# Patient Record
Sex: Female | Born: 2000 | Race: White | Hispanic: No | Marital: Married | Smoking: Never smoker
Health system: Southern US, Community
[De-identification: ages and names within clinical notes are randomized; demographics above are authoritative.]

## PROBLEM LIST (undated history)

## (undated) DIAGNOSIS — F909 Attention-deficit hyperactivity disorder, unspecified type: Secondary | ICD-10-CM

## (undated) HISTORY — DX: Attention-deficit hyperactivity disorder, unspecified type: F90.9

## (undated) HISTORY — PX: DENTAL SURGERY: SHX609

---

## 2020-08-18 NOTE — L&D Delivery Note (Signed)
Delivery Summary for Cynthia Schmitt  Labor Events:   Preterm labor: No data found  Rupture date: No data found  Rupture time: No data found  Rupture type: Possible ROM - for evaluation  Fluid Color: No data found  Induction: No data found  Augmentation: No data found  Complications: No data found  Cervical ripening: No data found No data found   No data found     Delivery:   Episiotomy: No data found  Lacerations: No data found  Repair suture: No data found  Repair # of packets: No data found  Blood loss (ml): 880   Information for the patient's newborn:  Kristia, Jupiter [[id number]]   Delivery 05/28/2021 5:02 PM by  Vaginal, Spontaneous Sex:  female Gestational Age: [redacted]w[redacted]d Delivery Clinician:   Living?:         APGARS  One minute Five minutes Ten minutes  Skin color:        Heart rate:        Grimace:        Muscle tone:        Breathing:        Totals: 6  6      Presentation/position:      Resuscitation:   Cord information:    Disposition of cord blood:     Blood gases sent?  Complications:   Placenta: Delivered:       appearance Newborn Measurements: Weight: 8 lb 6 oz (3800 g)  Height: 21.65"  Head circumference:    Chest circumference:    Other providers:    Additional  information: Forceps:   Vacuum:   Breech:   Observed anomalies      Delivery Note At 5:02 PM a viable and healthy female was delivered via Vaginal, Spontaneous (Presentation: Left Occiput Anterior).  APGAR: 6, 6; weight 8 lb 6 oz (3800 g).   Placenta status: Spontaneous, Intadct.  Cord: 3 vessels with the following complications: None.  Cord pH: not obtained. Delayed cord clamping was observed.   Anesthesia: Epidural Episiotomy: None Lacerations: 3rd degree perineal Suture Repair: 2.0 Vicryl; 3.0 Vicryl - rapide Est. Blood Loss (mL):  880  Mom to postpartum.  Baby to Couplet care / Skin to Skin.  Hildred Laser, MD 05/28/2021, 6:05 PM

## 2020-10-03 ENCOUNTER — Ambulatory Visit (INDEPENDENT_AMBULATORY_CARE_PROVIDER_SITE_OTHER): Payer: Commercial Managed Care - PPO | Admitting: Obstetrics and Gynecology

## 2020-10-03 ENCOUNTER — Encounter: Payer: Self-pay | Admitting: Obstetrics and Gynecology

## 2020-10-03 ENCOUNTER — Other Ambulatory Visit: Payer: Self-pay

## 2020-10-03 VITALS — BP 108/70 | HR 105 | Ht 66.0 in | Wt 114.8 lb

## 2020-10-03 DIAGNOSIS — N912 Amenorrhea, unspecified: Secondary | ICD-10-CM

## 2020-10-03 LAB — POCT URINE PREGNANCY: Preg Test, Ur: POSITIVE — AB

## 2020-10-03 NOTE — Progress Notes (Signed)
HPI:      Ms. Asaiah Hunnicutt is a 20 y.o. G1P0 who LMP was Patient's last menstrual period was 08/15/2020.  Subjective:   She presents today because she has missed a menstrual period and had a positive home pregnancy test.  She is approximately 7 weeks by last menstrual period.  She was attempting pregnancy and began prenatal vitamins prior to conception.  She has occasional nausea but is not vomiting.  She does describe mild pelvic cramping but denies bleeding.  She is very excited about being pregnant.    Hx: The following portions of the patient's history were reviewed and updated as appropriate:             She  has no past medical history on file. She does not have a problem list on file. She  has no past surgical history on file. Her family history includes Ovarian cancer in her mother. She  reports that she has never smoked. She has never used smokeless tobacco. She reports previous alcohol use. She reports previous drug use. She has a current medication list which includes the following prescription(s): multivitamin-prenatal. She has No Known Allergies.       Review of Systems:  Review of Systems  Constitutional: Denied constitutional symptoms, night sweats, recent illness, fatigue, fever, insomnia and weight loss.  Eyes: Denied eye symptoms, eye pain, photophobia, vision change and visual disturbance.  Ears/Nose/Throat/Neck: Denied ear, nose, throat or neck symptoms, hearing loss, nasal discharge, sinus congestion and sore throat.  Cardiovascular: Denied cardiovascular symptoms, arrhythmia, chest pain/pressure, edema, exercise intolerance, orthopnea and palpitations.  Respiratory: Denied pulmonary symptoms, asthma, pleuritic pain, productive sputum, cough, dyspnea and wheezing.  Gastrointestinal: Denied, gastro-esophageal reflux, melena, nausea and vomiting.  Genitourinary: Denied genitourinary symptoms including symptomatic vaginal discharge, pelvic relaxation issues, and urinary  complaints.  Musculoskeletal: Denied musculoskeletal symptoms, stiffness, swelling, muscle weakness and myalgia.  Dermatologic: Denied dermatology symptoms, rash and scar.  Neurologic: Denied neurology symptoms, dizziness, headache, neck pain and syncope.  Psychiatric: Denied psychiatric symptoms, anxiety and depression.  Endocrine: Denied endocrine symptoms including hot flashes and night sweats.   Meds:   Current Outpatient Medications on File Prior to Visit  Medication Sig Dispense Refill  . Prenatal Vit-Fe Fumarate-FA (MULTIVITAMIN-PRENATAL) 27-0.8 MG TABS tablet Take 1 tablet by mouth daily at 12 noon.     No current facility-administered medications on file prior to visit.          Objective:     Vitals:   10/03/20 1021  BP: 108/70  Pulse: (!) 105   Filed Weights   10/03/20 1021  Weight: 114 lb 12.8 oz (52.1 kg)              Urinary pregnancy test positive  Assessment:    G1P0 There are no problems to display for this patient.    1. Amenorrhea     Approximately 7 weeks estimated gestational age based on LMP.   Plan:            Prenatal Plan 1.  The patient was given prenatal literature. 2.  She was continued on prenatal vitamins. 3.  A prenatal lab panel to be drawn at nurse visit. 4.  An ultrasound was ordered to better determine an EDC. 5.  A nurse visit was scheduled. 6.  Genetic testing and testing for other inheritable conditions discussed in detail. She will decide in the future whether to have these labs performed. 7.  A general overview of pregnancy testing, visit schedule, ultrasound  schedule, and prenatal care was discussed. 8.  COVID and its risks associated with pregnancy, prevention by limiting exposure and use of masks, as well as the risks and benefits of vaccination during pregnancy were discussed in detail.  [facility] policy regarding office and hospital visitation and testing was explained. 9.  Benefits of breast-feeding discussed in detail  including both maternal and infant benefits. Ready Set Baby website discussed. 10.  Literature regarding nausea and vomiting in pregnancy given.   Orders Orders Placed This Encounter  Procedures  . US OB Comp Less 14 Wks  . POCT urine pregnancy    No orders of the defined types were placed in this encounter.     F/U  Return in about 6 weeks (around 11/14/2020). I spent 33 minutes involved in the care of this patient preparing to see the patient by obtaining and reviewing her medical history (including labs, imaging tests and prior procedures), documenting clinical information in the electronic health record (EHR), counseling and coordinating care plans, writing and sending prescriptions, ordering tests or procedures and directly communicating with the patient by discussing pertinent items from her history and physical exam as well as detailing my assessment and plan as noted above so that she has an informed understanding.  All of her questions were answered.  Elonda Husky, M.D. 10/03/2020 11:03 AM

## 2020-10-10 ENCOUNTER — Ambulatory Visit (INDEPENDENT_AMBULATORY_CARE_PROVIDER_SITE_OTHER): Payer: Commercial Managed Care - PPO

## 2020-10-10 ENCOUNTER — Other Ambulatory Visit: Payer: Self-pay

## 2020-10-10 DIAGNOSIS — N912 Amenorrhea, unspecified: Secondary | ICD-10-CM | POA: Diagnosis not present

## 2020-10-26 ENCOUNTER — Other Ambulatory Visit: Payer: Self-pay

## 2020-10-26 ENCOUNTER — Ambulatory Visit (INDEPENDENT_AMBULATORY_CARE_PROVIDER_SITE_OTHER): Payer: Commercial Managed Care - PPO

## 2020-10-26 VITALS — BP 107/72 | HR 112 | Ht 66.0 in | Wt 114.0 lb

## 2020-10-26 DIAGNOSIS — Z3401 Encounter for supervision of normal first pregnancy, first trimester: Secondary | ICD-10-CM | POA: Diagnosis not present

## 2020-10-26 DIAGNOSIS — Z0283 Encounter for blood-alcohol and blood-drug test: Secondary | ICD-10-CM

## 2020-10-26 DIAGNOSIS — Z113 Encounter for screening for infections with a predominantly sexual mode of transmission: Secondary | ICD-10-CM

## 2020-10-26 LAB — OB RESULTS CONSOLE GC/CHLAMYDIA: Gonorrhea: NEGATIVE

## 2020-10-26 LAB — OB RESULTS CONSOLE VARICELLA ZOSTER ANTIBODY, IGG: Varicella: IMMUNE

## 2020-10-26 NOTE — Patient Instructions (Signed)
WHAT OB PATIENTS CAN EXPECT   Confirmation of pregnancy and ultrasound ordered if medically indicated-[redacted] weeks gestation  New OB (NOB) intake with nurse and New OB (NOB) labs- [redacted] weeks gestation  New OB (NOB) physical examination with provider- 11/[redacted] weeks gestation  Flu vaccine-[redacted] weeks gestation  Anatomy scan-[redacted] weeks gestation  Glucose tolerance test, blood work to test for anemia, T-dap vaccine-[redacted] weeks gestation  Vaginal swabs/cultures-STD/Group B strep-[redacted] weeks gestation  Appointments every 4 weeks until 28 weeks  Every 2 weeks from 28 weeks until 36 weeks  Weekly visits from 36 weeks until delivery  [email or web address]  Prenatal Care Prenatal care is health care during pregnancy. It helps you and your unborn baby (fetus) stay as healthy as possible. Prenatal care may be provided by a midwife, a family practice doctor, a IT consultant (nurse practitioner or physician assistant), or a childbirth and pregnancy doctor (obstetrician). How does this affect me? During pregnancy, you will be closely monitored for any new conditions that might develop. To lower your risk of pregnancy complications, you and your health care provider will talk about any underlying conditions you have. How does this affect my baby? Early and consistent prenatal care increases the chance that your baby will be healthy during pregnancy. Prenatal care lowers the risk that your baby will be:  Born early (prematurely).  Smaller than expected at birth (small for gestational age). What can I expect at the first prenatal care visit? Your first prenatal care visit will likely be the longest. You should schedule your first prenatal care visit as soon as you know that you are pregnant. Your first visit is a good time to talk about any questions or concerns you have about pregnancy. Medical history At your visit, you and your health care provider will  talk about your medical history, including:  Any past pregnancies.  Your family's medical history.  Medical history of the baby's father.  Any long-term (chronic) health conditions you have and how you manage them.  Any surgeries or procedures you have had.  Any current over-the-counter or prescription medicines, herbs, or supplements that you are taking.  Other factors that could pose a risk to your baby, including: ? Exposure to harmful chemicals or radiation at work or at home. ? Any substance use, including tobacco, alcohol, and drug use.  Your home setting and your stress levels, including: ? Exposure to abuse or violence. ? Household financial strain.  Your daily health habits, including diet and exercise. Tests and screenings Your health care provider will:  Measure your weight, height, and blood pressure.  Do a physical exam, including a pelvic and breast exam.  Perform blood tests and urine tests to check for: ? Urinary tract infection. ? Sexually transmitted infections (STIs). ? Low iron levels in your blood (anemia). ? Blood type and certain proteins on red blood cells (Rh antibodies). ? Infections and immunity to viruses, such as hepatitis B and rubella. ? HIV (human immunodeficiency virus).  Discuss your options for genetic screening. Tips about staying healthy Your health care provider will also give you information about how to keep yourself and your baby healthy, including:  Nutrition and taking vitamins.  Physical activity.  How to manage pregnancy symptoms such as nausea and vomiting (morning sickness).  Infections and substances that may be harmful to your baby and how to avoid them.  Food safety.  Dental care.  Working.  Travel.  Warning signs to watch for and when to call your health care provider.  How often will I have prenatal care visits? After your first prenatal care visit, you will have regular visits throughout your pregnancy.  The visit schedule is often as follows:  Up to week 28 of pregnancy: once every 4 weeks.  28-36 weeks: once every 2 weeks.  After 36 weeks: every week until delivery. Some women may have visits more or less often depending on any underlying health conditions and the health of the baby. Keep all follow-up and prenatal care visits. This is important. What happens during routine prenatal care visits? Your health care provider will:  Measure your weight and blood pressure.  Check for fetal heart sounds.  Measure the height of your uterus in your abdomen (fundal height). This may be measured starting around week 20 of pregnancy.  Check the position of your baby inside your uterus.  Ask questions about your diet, sleeping patterns, and whether you can feel the baby move.  Review warning signs to watch for and signs of labor.  Ask about any pregnancy symptoms you are having and how you are dealing with them. Symptoms may include: ? Headaches. ? Nausea and vomiting. ? Vaginal discharge. ? Swelling. ? Fatigue. ? Constipation. ? Changes in your vision. ? Feeling persistently sad or anxious. ? Any discomfort, including back or pelvic pain. ? Bleeding or spotting. Make a list of questions to ask your health care provider at your routine visits.   What tests might I have during prenatal care visits? You may have blood, urine, and imaging tests throughout your pregnancy, such as:  Urine tests to check for glucose, protein, or signs of infection.  Glucose tests to check for a form of diabetes that can develop during pregnancy (gestational diabetes mellitus). This is usually done around week 24 of pregnancy.  Ultrasounds to check your baby's growth and development, to check for birth defects, and to check your baby's well-being. These can also help to decide when you should deliver your baby.  A test to check for group B strep (GBS) infection. This is usually done around week 36 of  pregnancy.  Genetic testing. This may include blood, fluid, or tissue sampling, or imaging tests, such as an ultrasound. Some genetic tests are done during the first trimester and some are done during the second trimester. What else can I expect during prenatal care visits? Your health care provider may recommend getting certain vaccines during pregnancy. These may include:  A yearly flu shot (annual influenza vaccine). This is especially important if you will be pregnant during flu season.  Tdap (tetanus, diphtheria, pertussis) vaccine. Getting this vaccine during pregnancy can protect your baby from whooping cough (pertussis) after birth. This vaccine may be recommended between weeks 27 and 36 of pregnancy.  A COVID-19 vaccine. Later in your pregnancy, your health care provider may give you information about:  Childbirth and breastfeeding classes.  Choosing a health care provider for your baby.  Umbilical cord banking.  Breastfeeding.  Birth control after your baby is born.  The hospital labor and delivery unit and how to set up a tour.  Registering at the hospital before you go into labor. Where to find more information  Office on Women's Health: LegalWarrants.gl  American Pregnancy Association: americanpregnancy.org  March of Dimes: marchofdimes.org Summary  Prenatal care helps you and your baby stay as healthy as possible during pregnancy.  Your first prenatal care visit will most likely be the longest.  You will have visits and tests throughout your pregnancy to monitor  your health and your baby's health.  Bring a list of questions to your visits to ask your health care provider.  Make sure to keep all follow-up and prenatal care visits. This information is not intended to replace advice given to you by your health care provider. Make sure you discuss any questions you have with your health care provider. Document Revised: 05/17/2020 Document Reviewed:  05/17/2020 Elsevier Patient Education  2021 [place]. Morning Sickness  Morning sickness is when you feel like you may vomit (feel nauseous) during pregnancy. Sometimes, you may vomit. Morning sickness most often happens in the morning, but it can also happen at any time of the day. Some women may have morning sickness that makes them vomit all the time. This is a more serious problem that needs treatment. What are the causes? The cause of this condition is not known. What increases the risk?  You had vomiting or a feeling like you may vomit before your pregnancy.  You had morning sickness in another pregnancy.  You are pregnant with more than one baby, such as twins. What are the signs or symptoms?  Feeling like you may vomit.  Vomiting. How is this treated? Treatment is usually not needed for this condition. You may only need to change what you eat. In some cases, your doctor may give you some things to take for your condition. These include:  Vitamin B6 supplements.  Medicines to treat the feeling that you may vomit.  Ginger. Follow these instructions at home: Medicines  Take over-the-counter and prescription medicines only as told by your doctor. Do not take any medicines until you talk with your doctor about them first.  Take multivitamins before you get pregnant. These can stop or lessen the symptoms of morning sickness. Eating and drinking  Eat dry toast or crackers before getting out of bed.  Eat 5 or 6 small meals a day.  Eat dry and bland foods like rice and baked potatoes.  Do not eat greasy, fatty, or spicy foods.  Have someone cook for you if the smell of food causes you to vomit or to feel like you may vomit.  If you feel like you may vomit after taking prenatal vitamins, take them at night or with a snack.  Eat protein foods when you need a snack. Nuts, yogurt, and cheese are good choices.  Drink fluids throughout the day.  Try ginger ale made  with real ginger, ginger tea made from fresh grated ginger, or ginger candies. General instructions  Do not smoke or use any products that contain nicotine or tobacco. If you need help quitting, ask your doctor.  Use an air purifier to keep the air in your house free of smells.  Get lots of fresh air.  Try to avoid smells that make you feel sick.  Try wearing an acupressure wristband. This is a wristband that is used to treat seasickness.  Try a treatment called acupuncture. In this treatment, a doctor puts needles into certain areas of your body to make you feel better. Contact a doctor if:  You need medicine to feel better.  You feel dizzy or light-headed.  You are losing weight. Get help right away if:  The feeling that you may vomit will not go away, or you cannot stop vomiting.  You faint.  You have very bad pain in your belly. Summary  Morning sickness is when you feel like you may vomit (feel nauseous) during pregnancy.  You may feel sick  in the morning, but you can feel this way at any time of the day.  Making some changes to what you eat may help your symptoms go away. This information is not intended to replace advice given to you by your health care provider. Make sure you discuss any questions you have with your health care provider. Document Revised: 03/19/2020 Document Reviewed: 02/27/2020 Elsevier Patient Education  2021 [employer]. How a Baby Grows During Pregnancy Pregnancy begins when a female's sperm enters a female's egg. This is called fertilization. Fertilization usually happens in one of the fallopian tubes that connect the ovaries to the uterus. The fertilized egg moves down the fallopian tube to the uterus. Once it reaches the uterus, it implants into the lining of the uterus and begins to grow. For the first 8 weeks, the fertilized egg is called an embryo. After 8 weeks, it is called a fetus. As the fetus continues to grow, it receives oxygen and  nutrients through the placenta, which is an organ that grows to support the developing baby. The placenta is the life support system for the baby. It provides oxygen and nutrition and removes waste. How long does a typical pregnancy last? A pregnancy usually lasts 280 days, or about 40 weeks. Pregnancy is divided into three periods of growth, also called trimesters:  First trimester: 0-12 weeks.  Second trimester: 13-27 weeks.  Third trimester: 28-40 weeks. The day when your baby is ready to be born (full term) is your estimated date of delivery. However, most babies are not born on their estimated date of delivery. How does my baby develop month by month? First month  The fertilized egg attaches to the inside of the uterus.  Some cells will form the placenta. Others will form the fetus.  The arms, legs, brain, spinal cord, lungs, and heart begin to develop.  At the end of the first month, the heart begins to beat. Second month  The bones, inner ear, eyelids, hands, and feet form.  The genitals develop.  By the end of 8 weeks, all major organs are developing. Third month  All of the internal organs are forming.  Teeth develop below the gums.  Bones and muscles begin to grow. The spine can flex.  The skin is transparent.  Fingernails and toenails begin to form.  Arms and legs continue to grow longer, and hands and feet develop.  The fetus is about 3 inches (7.6 cm) long. Fourth month  The placenta is completely formed.  The external sex organs, neck, outer ear, eyebrows, eyelids, and fingernails are formed.  The fetus can hear, swallow, and move its arms and legs.  The kidneys begin to produce urine.  The skin is covered with a white, waxy coating (vernix) and very fine hair (lanugo). Fifth month  The fetus moves around more and can be felt for the first time (quickening).  The fetus starts to sleep and wake up and may begin to suck a finger.  The nails grow  to the end of the fingers.  The organ in the digestive system that makes bile (gallbladder) functions and helps to digest nutrients.  If the fetus is a female, eggs are present in the ovaries. If the fetus is a female, testicles start to move down into the scrotum. Sixth month  The lungs are formed.  The eyes open. The brain continues to develop.  Your baby has fingerprints and toe prints. Your baby's hair grows thicker.  At the end of  the second trimester, the fetus is about 9 inches (22.9 cm) long. Seventh month  The fetus kicks and stretches.  The eyes are developed enough to sense changes in light.  The hands can make a grasping motion.  The fetus responds to sound. Eighth month  Most organs and body systems are fully developed and functioning.  Bones harden, and taste buds develop. The fetus may hiccup.  Certain areas of the brain are still developing. The skull remains soft. Ninth month  The fetus gains about  lb (0.23 kg) each week.  The lungs are fully developed.  Patterns of sleep develop.  The fetus's head typically moves into a head-down position (vertex) in the uterus to prepare for birth.  The fetus weighs 6-9 lb (2.72-4.08 kg) and is 19-20 inches (48.26-50.8 cm) long.   How do I know if my baby is developing well? Always talk with your health care provider about any concerns that you may have about your pregnancy and your baby. At each prenatal visit, your health care provider will do several different tests to check on your health and keep track of your baby's development. These include:  Fundal height and position. To do this, your health care provider will: ? Measure your growing belly from your pubic bone to the top of the uterus using a tape measure. ? Feel your belly to determine your baby's position.  Heartbeat. An ultrasound in the first trimester can confirm pregnancy and show a heartbeat, depending on how far along you are. Your health care  provider will check your baby's heart rate at every prenatal visit. You will also have a second trimester ultrasound to check your baby's development. Follow these instructions at home:  Take prenatal vitamins as told by your health care provider. These include vitamins such as folic acid, iron, calcium, and vitamin D. They are important for healthy development.  Take over-the-counter and prescription medicines only as told by your health care provider.  Keep all follow-up visits. This is important. Follow-up visits include prenatal care and screening tests. Summary  A pregnancy usually lasts 280 days, or about 40 weeks. Pregnancy is divided into three periods of growth, also called trimesters.  Your health care provider will monitor your baby's growth and development throughout your pregnancy.  Follow your health care provider's recommendations about taking prenatal vitamins and medicines during your pregnancy.  Talk with your health care provider if you have any concerns about your pregnancy or your developing baby. This information is not intended to replace advice given to you by your health care provider. Make sure you discuss any questions you have with your health care provider. Document Revised: 01/11/2020 Document Reviewed: 11/17/2019 Elsevier Patient Education  2021 [employer]. [email or web address]  First Trimester of Pregnancy  The first trimester of pregnancy starts on the first day of your last menstrual period until the end of week 12. This is also called months 1 through 3 of pregnancy. Body changes during your first trimester Your body goes through many changes during pregnancy. The changes usually return to normal after your baby is born. Physical changes  You may gain or lose weight.  Your breasts may grow larger and hurt. The area around your nipples may get darker.  Dark spots or blotches may develop on your face.  You may have  changes in your hair. Health changes  You may feel like you might vomit (nauseous), and you may vomit.  You may have heartburn.  You may have headaches.  You may have trouble pooping (constipation).  Your gums may bleed. Other changes  You may get tired easily.  You may pee (urinate) more often.  Your menstrual periods will stop.  You may not feel hungry.  You may want to eat certain kinds of food.  You may have changes in your emotions from day to day.  You may have more dreams. Follow these instructions at home: Medicines  Take over-the-counter and prescription medicines only as told by your doctor. Some medicines are not safe during pregnancy.  Take a prenatal vitamin that contains at least 600 micrograms (mcg) of folic acid. Eating and drinking  Eat healthy meals that include: ? Fresh fruits and vegetables. ? Whole grains. ? Good sources of protein, such as meat, eggs, or tofu. ? Low-fat dairy products.  Avoid raw meat and unpasteurized juice, milk, and cheese.  If you feel like you may vomit, or you vomit: ? Eat 4 or 5 small meals a day instead of 3 large meals. ? Try eating a few soda crackers. ? Drink liquids between meals instead of during meals.  You may need to take these actions to prevent or treat trouble pooping: ? Drink enough fluids to keep your pee (urine) pale yellow. ? Eat foods that are high in fiber. These include beans, whole grains, and fresh fruits and vegetables. ? Limit foods that are high in fat and sugar. These include fried or sweet foods. Activity  Exercise only as told by your doctor. Most people can do their usual exercise routine during pregnancy.  Stop exercising if you have cramps or pain in your lower belly (abdomen) or low back.  Do not exercise if it is too hot or too humid, or if you are in a place of great height (high altitude).  Avoid heavy lifting.  If you choose to, you may have sex unless your doctor tells you  not to. Relieving pain and discomfort  Wear a good support bra if your breasts are sore.  Rest with your legs raised (elevated) if you have leg cramps or low back pain.  If you have bulging veins (varicose veins) in your legs: ? Wear support hose as told by your doctor. ? Raise your feet for 15 minutes, 3-4 times a day. ? Limit salt in your food. Safety  Wear your seat belt at all times when you are in a car.  Talk with your doctor if someone is hurting you or yelling at you.  Talk with your doctor if you are feeling sad or have thoughts of hurting yourself. Lifestyle  Do not use hot tubs, steam rooms, or saunas.  Do not douche. Do not use tampons or scented sanitary pads.  Do not use herbal medicines, illegal drugs, or medicines that are not approved by your doctor. Do not drink alcohol.  Do not smoke or use any products that contain nicotine or tobacco. If you need help quitting, ask your doctor.  Avoid cat litter boxes and soil that is used by cats. These carry germs that can cause harm to the baby and can cause a loss of your baby by miscarriage or stillbirth. General instructions  Keep all follow-up visits. This is important.  Ask for help if you need counseling or if you need help with nutrition. Your doctor can give you advice or tell you where to go for help.  Visit your dentist. At home, brush your teeth with a soft toothbrush. Floss gently.  Write down your questions.  Take them to your prenatal visits. Where to find more information  American Pregnancy Association: americanpregnancy.org  [employer] of Obstetricians and Gynecologists: www.acog.org  Office on Women's Health: MightyReward.co.nz Contact a doctor if:  You are dizzy.  You have a fever.  You have mild cramps or pressure in your lower belly.  You have a nagging pain in your belly area.  You continue to feel like you may vomit, you vomit, or you have watery poop (diarrhea) for 24  hours or longer.  You have a bad-smelling fluid coming from your vagina.  You have pain when you pee.  You are exposed to a disease that spreads from person to person, such as chickenpox, measles, Zika virus, HIV, or hepatitis. Get help right away if:  You have spotting or bleeding from your vagina.  You have very bad belly cramping or pain.  You have shortness of breath or chest pain.  You have any kind of injury, such as from a fall or a car crash.  You have new or increased pain, swelling, or redness in an arm or leg. Summary  The first trimester of pregnancy starts on the first day of your last menstrual period until the end of week 12 (months 1 through 3).  Eat 4 or 5 small meals a day instead of 3 large meals.  Do not smoke or use any products that contain nicotine or tobacco. If you need help quitting, ask your doctor.  Keep all follow-up visits. This information is not intended to replace advice given to you by your health care provider. Make sure you discuss any questions you have with your health care provider. Document Revised: 01/11/2020 Document Reviewed: 11/17/2019 Elsevier Patient Education  2021 [employer]. [email or web address]">  First Trimester of Pregnancy  The first trimester of pregnancy starts on the first day of your last menstrual period until the end of week 12. This is also called months 1 through 3 of pregnancy. Body changes during your first trimester Your body goes through many changes during pregnancy. The changes usually return to normal after your baby is born. Physical changes  You may gain or lose weight.  Your breasts may grow larger and hurt. The area around your nipples may get darker.  Dark spots or blotches may develop on your face.  You may have changes in your hair. Health changes  You may feel like you might vomit (nauseous), and you may vomit.  You may have heartburn.  You may have  headaches.  You may have trouble pooping (constipation).  Your gums may bleed. Other changes  You may get tired easily.  You may pee (urinate) more often.  Your menstrual periods will stop.  You may not feel hungry.  You may want to eat certain kinds of food.  You may have changes in your emotions from day to day.  You may have more dreams. Follow these instructions at home: Medicines  Take over-the-counter and prescription medicines only as told by your doctor. Some medicines are not safe during pregnancy.  Take a prenatal vitamin that contains at least 600 micrograms (mcg) of folic acid. Eating and drinking  Eat healthy meals that include: ? Fresh fruits and vegetables. ? Whole grains. ? Good sources of protein, such as meat, eggs, or tofu. ? Low-fat dairy products.  Avoid raw meat and unpasteurized juice, milk, and cheese.  If you feel like you may vomit, or you vomit: ? Eat 4 or 5 small meals a day instead  of 3 large meals. ? Try eating a few soda crackers. ? Drink liquids between meals instead of during meals.  You may need to take these actions to prevent or treat trouble pooping: ? Drink enough fluids to keep your pee (urine) pale yellow. ? Eat foods that are high in fiber. These include beans, whole grains, and fresh fruits and vegetables. ? Limit foods that are high in fat and sugar. These include fried or sweet foods. Activity  Exercise only as told by your doctor. Most people can do their usual exercise routine during pregnancy.  Stop exercising if you have cramps or pain in your lower belly (abdomen) or low back.  Do not exercise if it is too hot or too humid, or if you are in a place of great height (high altitude).  Avoid heavy lifting.  If you choose to, you may have sex unless your doctor tells you not to. Relieving pain and discomfort  Wear a good support bra if your breasts are sore.  Rest with your legs raised (elevated) if you have leg  cramps or low back pain.  If you have bulging veins (varicose veins) in your legs: ? Wear support hose as told by your doctor. ? Raise your feet for 15 minutes, 3-4 times a day. ? Limit salt in your food. Safety  Wear your seat belt at all times when you are in a car.  Talk with your doctor if someone is hurting you or yelling at you.  Talk with your doctor if you are feeling sad or have thoughts of hurting yourself. Lifestyle  Do not use hot tubs, steam rooms, or saunas.  Do not douche. Do not use tampons or scented sanitary pads.  Do not use herbal medicines, illegal drugs, or medicines that are not approved by your doctor. Do not drink alcohol.  Do not smoke or use any products that contain nicotine or tobacco. If you need help quitting, ask your doctor.  Avoid cat litter boxes and soil that is used by cats. These carry germs that can cause harm to the baby and can cause a loss of your baby by miscarriage or stillbirth. General instructions  Keep all follow-up visits. This is important.  Ask for help if you need counseling or if you need help with nutrition. Your doctor can give you advice or tell you where to go for help.  Visit your dentist. At home, brush your teeth with a soft toothbrush. Floss gently.  Write down your questions. Take them to your prenatal visits. Where to find more information  American Pregnancy Association: americanpregnancy.org  [employer]: www.acog.org  Office on Women's Health: KeywordPortfolios.com.br Contact a doctor if:  You are dizzy.  You have a fever.  You have mild cramps or pressure in your lower belly.  You have a nagging pain in your belly area.  You continue to feel like you may vomit, you vomit, or you have watery poop (diarrhea) for 24 hours or longer.  You have a bad-smelling fluid coming from your vagina.  You have pain when you pee.  You are exposed to a disease that  spreads from person to person, such as chickenpox, measles, Zika virus, HIV, or hepatitis. Get help right away if:  You have spotting or bleeding from your vagina.  You have very bad belly cramping or pain.  You have shortness of breath or chest pain.  You have any kind of injury, such as from a  fall or a car crash.  You have new or increased pain, swelling, or redness in an arm or leg. Summary  The first trimester of pregnancy starts on the first day of your last menstrual period until the end of week 12 (months 1 through 3).  Eat 4 or 5 small meals a day instead of 3 large meals.  Do not smoke or use any products that contain nicotine or tobacco. If you need help quitting, ask your doctor.  Keep all follow-up visits. This information is not intended to replace advice given to you by your health care provider. Make sure you discuss any questions you have with your health care provider. Document Revised: 01/11/2020 Document Reviewed: 11/17/2019 Elsevier Patient Education  2021 [employer]. Commonly Asked Questions During Pregnancy  Cats: A parasite can be excreted in cat feces.  To avoid exposure you need to have another person empty the little box.  If you must empty the litter box you will need to wear gloves.  Wash your hands after handling your cat.  This parasite can also be found in raw or undercooked meat so this should also be avoided.  Colds, Sore Throats, Flu: Please check your medication sheet to see what you can take for symptoms.  If your symptoms are unrelieved by these medications please call the office.  Dental Work: Most any dental work Investment banker, corporate recommends is permitted.  X-rays should only be taken during the first trimester if absolutely necessary.  Your abdomen should be shielded with a lead apron during all x-rays.  Please notify your provider prior to receiving any x-rays.  Novocaine is fine; gas is not recommended.  If your dentist requires a note from [place]  prior to dental work please call the office and we will provide one for you.  Exercise: Exercise is an important part of staying healthy during your pregnancy.  You may continue most exercises you were accustomed to prior to pregnancy.  Later in your pregnancy you will most likely notice you have difficulty with activities requiring balance like riding a bicycle.  It is important that you listen to your body and avoid activities that put you at a higher risk of falling.  Adequate rest and staying well hydrated are a must!  If you have questions about the safety of specific activities ask your provider.    Exposure to Children with illness: Try to avoid obvious exposure; report any symptoms to [place] when noted,  If you have chicken pos, red measles or mumps, you should be immune to these diseases.   Please do not take any vaccines while pregnant unless you have checked with your OB provider.  Fetal Movement: After 28 weeks we recommend you do "kick counts" twice daily.  Lie or sit down in a calm quiet environment and count your baby movements "kicks".  You should feel your baby at least 10 times per hour.  If you have not felt 10 kicks within the first hour get up, walk around and have something sweet to eat or drink then repeat for an additional hour.  If count remains less than 10 per hour notify your provider.  Fumigating: Follow your pest control agent's advice as to how long to stay out of your home.  Ventilate the area well before re-entering.  Hemorrhoids:   Most over-the-counter preparations can be used during pregnancy.  Check your medication to see what is safe to use.  It is important to use a stool softener or  fiber in your diet and to drink lots of liquids.  If hemorrhoids seem to be getting worse please call the office.   Hot Tubs:  Hot tubs Jacuzzis and saunas are not recommended while pregnant.  These increase your internal body temperature and should be avoided.  Intercourse:  Sexual  intercourse is safe during pregnancy as long as you are comfortable, unless otherwise advised by your provider.  Spotting may occur after intercourse; report any bright red bleeding that is heavier than spotting.  Labor:  If you know that you are in labor, please go to the hospital.  If you are unsure, please call the office and let us help you decide what to do.  Lifting, straining, etc:  If your job requires heavy lifting or straining please check with your provider for any limitations.  Generally, you should not lift items heavier than that you can lift simply with your hands and arms (no back muscles)  Painting:  Paint fumes do not harm your pregnancy, but may make you ill and should be avoided if possible.  Latex or water based paints have less odor than oils.  Use adequate ventilation while painting.  Permanents & Hair Color:  Chemicals in hair dyes are not recommended as they cause increase hair dryness which can increase hair loss during pregnancy.  " Highlighting" and permanents are allowed.  Dye may be absorbed differently and permanents may not hold as well during pregnancy.  Sunbathing:  Use a sunscreen, as skin burns easily during pregnancy.  Drink plenty of fluids; avoid over heating.  Tanning Beds:  Because their possible side effects are still unknown, tanning beds are not recommended.  Ultrasound Scans:  Routine ultrasounds are performed at approximately 20 weeks.  You will be able to see your baby's general anatomy an if you would like to know the gender this can usually be determined as well.  If it is questionable when you conceived you may also receive an ultrasound early in your pregnancy for dating purposes.  Otherwise ultrasound exams are not routinely performed unless there is a medical necessity.  Although you can request a scan we ask that you pay for it when conducted because insurance does not cover " patient request" scans.  Work: If your pregnancy proceeds without  complications you may work until your due date, unless your physician or employer advises otherwise.  Round Ligament Pain/Pelvic Discomfort:  Sharp, shooting pains not associated with bleeding are fairly common, usually occurring in the second trimester of pregnancy.  They tend to be worse when standing up or when you remain standing for long periods of time.  These are the result of pressure of certain pelvic ligaments called "round ligaments".  Rest, Tylenol and heat seem to be the most effective relief.  As the womb and fetus grow, they rise out of the pelvis and the discomfort improves.  Please notify the office if your pain seems different than that described.  It may represent a more serious condition.  Common Medications Safe in Pregnancy  Acne:      Constipation:  Benzoyl Peroxide     Colace  Clindamycin      Dulcolax Suppository  Topica Erythromycin     Fibercon  Salicylic Acid      Metamucil         Miralax AVOID:        Senakot   Accutane    Cough:  Retin-A       Cough Drops  Tetracycline  Phenergan w/ Codeine if Rx  Minocycline      Robitussin (Plain & DM)  Antibiotics:     Crabs/Lice:  Ceclor       RID  Cephalosporins    AVOID:  E-Mycins      Kwell  Keflex  Macrobid/Macrodantin   Diarrhea:  Penicillin      Kao-Pectate  Zithromax      Imodium AD         PUSH FLUIDS AVOID:       Cipro     Fever:  Tetracycline      Tylenol (Regular or Extra  Minocycline       Strength)  Levaquin      Extra Strength-Do not          Exceed 8 tabs/24 hrs Caffeine:        '200mg'$ /day (equiv. To 1 cup of coffee or  approx. 3 12 oz sodas)         Gas: Cold/Hayfever:       Gas-X  Benadryl      Mylicon  Claritin       Phazyme  **Claritin-D        Chlor-Trimeton    Headaches:  Dimetapp      ASA-Free Excedrin  Drixoral-Non-Drowsy     Cold Compress  Mucinex (Guaifenasin)     Tylenol (Regular or Extra  Sudafed/Sudafed-12 Hour     Strength)  **Sudafed PE Pseudoephedrine   Tylenol Cold &  Sinus     Vicks Vapor Rub  Zyrtec  **AVOID if Problems With Blood Pressure         Heartburn: Avoid lying down for at least 1 hour after meals  Aciphex      Maalox     Rash:  Milk of Magnesia     Benadryl    Mylanta       1% Hydrocortisone Cream  Pepcid  Pepcid Complete   Sleep Aids:  Prevacid      Ambien   Prilosec       Benadryl  Rolaids       Chamomile Tea  Tums (Limit 4/day)     Unisom         Tylenol PM         Warm milk-add vanilla or  Hemorrhoids:       Sugar for taste  Anusol/Anusol H.C.  (RX: Analapram 2.5%)  Sugar Substitutes:  Hydrocortisone OTC     Ok in moderation  Preparation H      Tucks        Vaseline lotion applied to tissue with wiping    Herpes:     Throat:  Acyclovir      Oragel  Famvir  Valtrex     Vaccines:         Flu Shot Leg Cramps:       *Gardasil  Benadryl      Hepatitis A         Hepatitis B Nasal Spray:       Pneumovax  Saline Nasal Spray     Polio Booster         Tetanus Nausea:       Tuberculosis test or PPD  Vitamin B6 25 mg TID   AVOID:    Dramamine      *Gardasil  Emetrol       Live Poliovirus  Ginger Root 250 mg QID    MMR (measles, mumps &  High Complex Carbs @ Bedtime    rebella)  Sea Bands-Accupressure    Varicella (Chickenpox)  Unisom 1/2 tab TID     *No known complications           If received before Pain:         Known pregnancy;   Darvocet       Resume series after  Lortab        Delivery  Percocet    Yeast:   Tramadol      Femstat  Tylenol 3      Gyne-lotrimin  Ultram       Monistat  Vicodin           MISC:         All Sunscreens           Hair Coloring/highlights          Insect Repellant's          (Including DEET)         Mystic Tans

## 2020-10-26 NOTE — Progress Notes (Signed)
      Cynthia Schmitt presents for NOB nurse intake visit. Pregnancy confirmation done at [facility], 10/03/2020, with Linzie Collin, MD.  G 1.  P 0.  LMP 08/15/2020.  EDD 05/22/2021 .  Ga 10 w2d. Pregnancy education material explained and given.  3 cats in the home.  NOB labs ordered. BMI less than 30. TSH/HbgA1c not ordered. Sickle cell not ordered due to race. HIV and drug screen explained and ordered. Genetic screening discussed. Genetic testing; Unsure. Pt to discuss genetic testing with provider. PNV encouraged. Pt to follow up with provider in  2 weeks for NOB physical. [facility] Financial Policy, HIV/Drug screening information reviewed and signed by patient.

## 2020-10-27 LAB — NICOTINE SCREEN, URINE: Cotinine Ql Scrn, Ur: NEGATIVE ng/mL

## 2020-10-27 LAB — DRUG PROFILE, UR, 9 DRUGS (LABCORP)
Amphetamines, Urine: NEGATIVE ng/mL
Barbiturate Quant, Ur: NEGATIVE ng/mL
Benzodiazepine Quant, Ur: NEGATIVE ng/mL
Cannabinoid Quant, Ur: NEGATIVE ng/mL
Cocaine (Metab.): NEGATIVE ng/mL
Methadone Screen, Urine: NEGATIVE ng/mL
Opiate Quant, Ur: NEGATIVE ng/mL
PCP Quant, Ur: NEGATIVE ng/mL
Propoxyphene: NEGATIVE ng/mL

## 2020-10-28 LAB — GC/CHLAMYDIA PROBE AMP
Chlamydia trachomatis, NAA: NEGATIVE
Neisseria Gonorrhoeae by PCR: NEGATIVE

## 2020-10-29 LAB — URINALYSIS, ROUTINE W REFLEX MICROSCOPIC

## 2020-10-30 LAB — ABO AND RH: Rh Factor: NEGATIVE

## 2020-10-30 LAB — TOXOPLASMA ANTIBODIES- IGG AND  IGM
Toxoplasma Antibody- IgM: <3 AU/mL (ref 0.0–7.9)
Toxoplasma IgG Ratio: <3 IU/mL (ref 0.0–7.1)

## 2020-10-30 LAB — VARICELLA ZOSTER ANTIBODY, IGG: Varicella zoster IgG: 185 index (ref >165)

## 2020-10-30 LAB — ANTIBODY SCREEN: Antibody Screen: NEGATIVE

## 2020-10-30 LAB — HCV INTERPRETATION

## 2020-10-30 LAB — VIRAL HEPATITIS HBV, HCV
HCV Ab: 0.1 s/co ratio (ref 0.0–0.9)
Hep B Core Total Ab: NEGATIVE
Hep B Surface Ab, Qual: NONREACTIVE
Hepatitis B Surface Ag: NEGATIVE

## 2020-10-30 LAB — HIV ANTIBODY (ROUTINE TESTING W REFLEX): HIV Screen 4th Generation wRfx: NONREACTIVE

## 2020-10-30 LAB — RPR: RPR Ser Ql: NONREACTIVE

## 2020-10-30 LAB — RUBELLA SCREEN: Rubella Antibodies, IGG: 2.02 index (ref >0.99)

## 2020-11-01 LAB — CULTURE, OB URINE

## 2020-11-01 LAB — URINE CULTURE, OB REFLEX: Organism ID, Bacteria: NO GROWTH

## 2020-11-14 ENCOUNTER — Ambulatory Visit (INDEPENDENT_AMBULATORY_CARE_PROVIDER_SITE_OTHER): Payer: Medicaid Other | Admitting: Obstetrics and Gynecology

## 2020-11-14 ENCOUNTER — Encounter: Payer: Self-pay | Admitting: Obstetrics and Gynecology

## 2020-11-14 ENCOUNTER — Other Ambulatory Visit: Payer: Self-pay

## 2020-11-14 VITALS — BP 116/76 | HR 109 | Wt 116.4 lb

## 2020-11-14 DIAGNOSIS — Z3401 Encounter for supervision of normal first pregnancy, first trimester: Secondary | ICD-10-CM

## 2020-11-14 DIAGNOSIS — Z3A13 13 weeks gestation of pregnancy: Secondary | ICD-10-CM

## 2020-11-14 NOTE — Progress Notes (Signed)
NOB: Nausea and vomiting has resolved.  Patient doing well.  Desires genetic testing today.  aFP next visit.  Ultrasound scheduled for anatomy at 20 weeks.  Physical examination General NAD, Conversant  HEENT Atraumatic; Op clear with mmm.  Normo-cephalic. Pupils reactive. Anicteric sclerae  Thyroid/Neck Smooth without nodularity or enlargement. Normal ROM.  Neck Supple.  Skin No rashes, lesions or ulceration. Normal palpated skin turgor. No nodularity.  Breasts: No masses or discharge.  Symmetric.  No axillary adenopathy.  Lungs: Clear to auscultation.No rales or wheezes. Normal Respiratory effort, no retractions.  Heart: NSR.  No murmurs or rubs appreciated. No periferal edema  Abdomen: Soft.  Non-tender.  No masses.  No HSM. No hernia  Extremities: Moves all appropriately.  Normal ROM for age. No lymphadenopathy.  Neuro: Oriented to PPT.  Normal mood. Normal affect.     Pelvic:   Vulva: Normal appearance.  No lesions.  Vagina: No lesions or abnormalities noted.  Support: Normal pelvic support.  Urethra No masses tenderness or scarring.  Meatus Normal size without lesions or prolapse.  Cervix: Normal appearance.  No lesions.  Anus: Normal exam.  No lesions.  Perineum: Normal exam.  No lesions.        Bimanual   Adnexae: No masses.  Non-tender to palpation.  Uterus: Enlarged. 13wks Pos FHTs  Non-tender.  Mobile.  AV.  Adnexae: No masses.  Non-tender to palpation.  Cul-de-sac: Negative for abnormality.  Adnexae: No masses.  Non-tender to palpation.         Pelvimetry   Diagonal: Reached.  Spines: Average.  Sacrum: Concave.  Pubic Arch: Normal.

## 2020-11-14 NOTE — Addendum Note (Signed)
Addended by: Dorian Pod on: 11/14/2020 10:38 AM   Modules accepted: Orders

## 2020-11-20 ENCOUNTER — Other Ambulatory Visit: Payer: Medicaid Other

## 2020-11-20 ENCOUNTER — Other Ambulatory Visit: Payer: Self-pay

## 2020-11-25 LAB — MATERNIT21  PLUS CORE+ESS+SCA, BLOOD
11q23 deletion (Jacobsen): NOT DETECTED
15q11 deletion (PW Angelman): NOT DETECTED
1p36 deletion syndrome: NOT DETECTED
22q11 deletion (DiGeorge): NOT DETECTED
4p16 deletion(Wolf-Hirschhorn): NOT DETECTED
5p15 deletion (Cri-du-chat): NOT DETECTED
8q24 deletion (Langer-Giedion): NOT DETECTED
Fetal Fraction: 14
Monosomy X (Turner Syndrome): NOT DETECTED
Result (T21): NEGATIVE
Trisomy 13 (Patau syndrome): NEGATIVE
Trisomy 16: NOT DETECTED
Trisomy 18 (Edwards syndrome): NEGATIVE
Trisomy 21 (Down syndrome): NEGATIVE
Trisomy 22: NOT DETECTED
XXX (Triple X Syndrome): NOT DETECTED
XXY (Klinefelter Syndrome): NOT DETECTED
XYY (Jacobs Syndrome): NOT DETECTED

## 2020-12-11 NOTE — Patient Instructions (Addendum)
Second Trimester of Pregnancy  The second trimester of pregnancy is from week 13 through week 27. This is also called months 4 through 6 of pregnancy. This is often the time when you feel your best. During the second trimester:  Morning sickness is less or has stopped.  You may have more energy.  You may feel hungry more often. At this time, your unborn baby (fetus) is growing very fast. At the end of the sixth month, the unborn baby may be up to 12 inches long and weigh about 1 pounds. You will likely start to feel the baby move between 16 and 20 weeks of pregnancy. Body changes during your second trimester Your body continues to go through many changes during this time. The changes vary and generally return to normal after the baby is born. Physical changes  You will gain more weight.  You may start to get stretch marks on your hips, belly (abdomen), and breasts.  Your breasts will grow and may hurt.  Dark spots or blotches may develop on your face.  A dark line from your belly button to the pubic area (linea nigra) may appear.  You may have changes in your hair. Health changes  You may have headaches.  You may have heartburn.  You may have trouble pooping (constipation).  You may have hemorrhoids or swollen, bulging veins (varicose veins).  Your gums may bleed.  You may pee (urinate) more often.  You may have back pain. Follow these instructions at home: Medicines  Take over-the-counter and prescription medicines only as told by your doctor. Some medicines are not safe during pregnancy.  Take a prenatal vitamin that contains at least 600 micrograms (mcg) of folic acid. Eating and drinking  Eat healthy meals that include: ? Fresh fruits and vegetables. ? Whole grains. ? Good sources of protein, such as meat, eggs, or tofu. ? Low-fat dairy products.  Avoid raw meat and unpasteurized juice, milk, and cheese.  You may need to take these actions to prevent or  treat trouble pooping: ? Drink enough fluids to keep your pee (urine) pale yellow. ? Eat foods that are high in fiber. These include beans, whole grains, and fresh fruits and vegetables. ? Limit foods that are high in fat and sugar. These include fried or sweet foods. Activity  Exercise only as told by your doctor. Most people can do their usual exercise during pregnancy. Try to exercise for 30 minutes at least 5 days a week.  Stop exercising if you have pain or cramps in your belly or lower back.  Do not exercise if it is too hot or too humid, or if you are in a place of great height (high altitude).  Avoid heavy lifting.  If you choose to, you may have sex unless your doctor tells you not to. Relieving pain and discomfort  Wear a good support bra if your breasts are sore.  Take warm water baths (sitz baths) to soothe pain or discomfort caused by hemorrhoids. Use hemorrhoid cream if your doctor approves.  Rest with your legs raised (elevated) if you have leg cramps or low back pain.  If you develop bulging veins in your legs: ? Wear support hose as told by your doctor. ? Raise your feet for 15 minutes, 3-4 times a day. ? Limit salt in your food. Safety  Wear your seat belt at all times when you are in a car.  Talk with your doctor if someone is hurting you or yelling  at you a lot. Lifestyle  Do not use hot tubs, steam rooms, or saunas.  Do not douche. Do not use tampons or scented sanitary pads.  Avoid cat litter boxes and soil used by cats. These carry germs that can harm your baby and can cause a loss of your baby by miscarriage or stillbirth.  Do not use herbal medicines, illegal drugs, or medicines that are not approved by your doctor. Do not drink alcohol.  Do not smoke or use any products that contain nicotine or tobacco. If you need help quitting, ask your doctor. General instructions  Keep all follow-up visits. This is important.  Ask your doctor about local  prenatal classes.  Ask your doctor about the right foods to eat or for help finding a counselor. Where to find more information  American Pregnancy Association: americanpregnancy.org  American College of Obstetricians and Gynecologists: www.acog.org  Office on Women's Health: womenshealth.gov/pregnancy Contact a doctor if:  You have a headache that does not go away when you take medicine.  You have changes in how you see, or you see spots in front of your eyes.  You have mild cramps, pressure, or pain in your lower belly.  You continue to feel like you may vomit (nauseous), you vomit, or you have watery poop (diarrhea).  You have bad-smelling fluid coming from your vagina.  You have pain when you pee or your pee smells bad.  You have very bad swelling of your face, hands, ankles, feet, or legs.  You have a fever. Get help right away if:  You are leaking fluid from your vagina.  You have spotting or bleeding from your vagina.  You have very bad belly cramping or pain.  You have trouble breathing.  You have chest pain.  You faint.  You have not felt your baby move for the time period told by your doctor.  You have new or increased pain, swelling, or redness in an arm or leg. Summary  The second trimester of pregnancy is from week 13 through week 27 (months 4 through 6).  Eat healthy meals.  Exercise as told by your doctor. Most people can do their usual exercise during pregnancy.  Do not use herbal medicines, illegal drugs, or medicines that are not approved by your doctor. Do not drink alcohol.  Call your doctor if you get sick or if you notice anything unusual about your pregnancy. This information is not intended to replace advice given to you by your health care provider. Make sure you discuss any questions you have with your health care provider. Document Revised: 01/11/2020 Document Reviewed: 11/17/2019 Elsevier Patient Education  2021 [employer]  Inc. Common Medications Safe in Pregnancy  Acne:      Constipation:  Benzoyl Peroxide     Colace  Clindamycin      Dulcolax Suppository  Topica Erythromycin     Fibercon  Salicylic Acid      Metamucil         Miralax AVOID:        Senakot   Accutane    Cough:  Retin-A       Cough Drops  Tetracycline      Phenergan w/ Codeine if Rx  Minocycline      Robitussin (Plain & DM)  Antibiotics:     Crabs/Lice:  Ceclor       RID  Cephalosporins    AVOID:  E-Mycins      Kwell  Keflex  Macrobid/Macrodantin   Diarrhea:    Penicillin      Kao-Pectate  Zithromax      Imodium AD         PUSH FLUIDS AVOID:       Cipro     Fever:  Tetracycline      Tylenol (Regular or Extra  Minocycline       Strength)  Levaquin      Extra Strength-Do not          Exceed 8 tabs/24 hrs Caffeine:        <200mg/day (equiv. To 1 cup of coffee or  approx. 3 12 oz sodas)         Gas: Cold/Hayfever:       Gas-X  Benadryl      Mylicon  Claritin       Phazyme  **Claritin-D        Chlor-Trimeton    Headaches:  Dimetapp      ASA-Free Excedrin  Drixoral-Non-Drowsy     Cold Compress  Mucinex (Guaifenasin)     Tylenol (Regular or Extra  Sudafed/Sudafed-12 Hour     Strength)  **Sudafed PE Pseudoephedrine   Tylenol Cold & Sinus     Vicks Vapor Rub  Zyrtec  **AVOID if Problems With Blood Pressure         Heartburn: Avoid lying down for at least 1 hour after meals  Aciphex      Maalox     Rash:  Milk of Magnesia     Benadryl    Mylanta       1% Hydrocortisone Cream  Pepcid  Pepcid Complete   Sleep Aids:  Prevacid      Ambien   Prilosec       Benadryl  Rolaids       Chamomile Tea  Tums (Limit 4/day)     Unisom         Tylenol PM         Warm milk-add vanilla or  Hemorrhoids:       Sugar for taste  Anusol/Anusol H.C.  (RX: Analapram 2.5%)  Sugar Substitutes:  Hydrocortisone OTC     Ok in moderation  Preparation H      Tucks        Vaseline lotion applied to tissue with  wiping    Herpes:     Throat:  Acyclovir      Oragel  Famvir  Valtrex     Vaccines:         Flu Shot Leg Cramps:       *Gardasil  Benadryl      Hepatitis A         Hepatitis B Nasal Spray:       Pneumovax  Saline Nasal Spray     Polio Booster         Tetanus Nausea:       Tuberculosis test or PPD  Vitamin B6 25 mg TID   AVOID:    Dramamine      *Gardasil  Emetrol       Live Poliovirus  Ginger Root 250 mg QID    MMR (measles, mumps &  High Complex Carbs @ Bedtime    rebella)  Sea Bands-Accupressure    Varicella (Chickenpox)  Unisom 1/2 tab TID     *No known complications           If received before Pain:         Known pregnancy;   Darvocet       Resume   series after  Lortab        Delivery  Percocet    Yeast:   Tramadol      Femstat  Tylenol 3      Gyne-lotrimin  Ultram       Monistat  Vicodin           MISC:         All Sunscreens           Hair Coloring/highlights          Insect Repellant's          (Including DEET)         Mystic Tans     Breastfeeding  Choosing to breastfeed is one of the best decisions you can make for yourself and your baby. A change in hormones during pregnancy causes your breasts to make breast milk in your milk-producing glands. Hormones prevent breast milk from being released before your baby is born. They also prompt milk flow after birth. Once breastfeeding has begun, thoughts of your baby, as well as his or her sucking or crying, can stimulate the release of milk from your milk-producing glands. Benefits of breastfeeding Research shows that breastfeeding offers many health benefits for infants and mothers. It also offers a cost-free and convenient way to feed your baby. For your baby  Your first milk (colostrum) helps your baby's digestive system to function better.  Special cells in your milk (antibodies) help your baby to fight off infections.  Breastfed babies are less likely to develop asthma, allergies, obesity, or type 2 diabetes.  They are also at lower risk for sudden infant death syndrome (SIDS).  Nutrients in breast milk are better able to meet your baby's needs compared to infant formula.  Breast milk improves your baby's brain development. For you  Breastfeeding helps to create a very special bond between you and your baby.  Breastfeeding is convenient. Breast milk costs nothing and is always available at the correct temperature.  Breastfeeding helps to burn calories. It helps you to lose the weight that you gained during pregnancy.  Breastfeeding makes your uterus return faster to its size before pregnancy. It also slows bleeding (lochia) after you give birth.  Breastfeeding helps to lower your risk of developing type 2 diabetes, osteoporosis, rheumatoid arthritis, cardiovascular disease, and breast, ovarian, uterine, and endometrial cancer later in life. Breastfeeding basics Starting breastfeeding  Find a comfortable place to sit or lie down, with your neck and back well-supported.  Place a pillow or a rolled-up blanket under your baby to bring him or her to the level of your breast (if you are seated). Nursing pillows are specially designed to help support your arms and your baby while you breastfeed.  Make sure that your baby's tummy (abdomen) is facing your abdomen.  Gently massage your breast. With your fingertips, massage from the outer edges of your breast inward toward the nipple. This encourages milk flow. If your milk flows slowly, you may need to continue this action during the feeding.  Support your breast with 4 fingers underneath and your thumb above your nipple (make the letter "C" with your hand). Make sure your fingers are well away from your nipple and your baby's mouth.  Stroke your baby's lips gently with your finger or nipple.  When your baby's mouth is open wide enough, quickly bring your baby to your breast, placing your entire nipple and as much of the areola as possible into your  baby's mouth. The areola   is the colored area around your nipple. ? More areola should be visible above your baby's upper lip than below the lower lip. ? Your baby's lips should be opened and extended outward (flanged) to ensure an adequate, comfortable latch. ? Your baby's tongue should be between his or her lower gum and your breast.  Make sure that your baby's mouth is correctly positioned around your nipple (latched). Your baby's lips should create a seal on your breast and be turned out (everted).  It is common for your baby to suck about 2-3 minutes in order to start the flow of breast milk. Latching Teaching your baby how to latch onto your breast properly is very important. An improper latch can cause nipple pain, decreased milk supply, and poor weight gain in your baby. Also, if your baby is not latched onto your nipple properly, he or she may swallow some air during feeding. This can make your baby fussy. Burping your baby when you switch breasts during the feeding can help to get rid of the air. However, teaching your baby to latch on properly is still the best way to prevent fussiness from swallowing air while breastfeeding. Signs that your baby has successfully latched onto your nipple  Silent tugging or silent sucking, without causing you pain. Infant's lips should be extended outward (flanged).  Swallowing heard between every 3-4 sucks once your milk has started to flow (after your let-down milk reflex occurs).  Muscle movement above and in front of his or her ears while sucking. Signs that your baby has not successfully latched onto your nipple  Sucking sounds or smacking sounds from your baby while breastfeeding.  Nipple pain. If you think your baby has not latched on correctly, slip your finger into the corner of your baby's mouth to break the suction and place it between your baby's gums. Attempt to start breastfeeding again. Signs of successful breastfeeding Signs from your  baby  Your baby will gradually decrease the number of sucks or will completely stop sucking.  Your baby will fall asleep.  Your baby's body will relax.  Your baby will retain a small amount of milk in his or her mouth.  Your baby will let go of your breast by himself or herself. Signs from you  Breasts that have increased in firmness, weight, and size 1-3 hours after feeding.  Breasts that are softer immediately after breastfeeding.  Increased milk volume, as well as a change in milk consistency and color by the fifth day of breastfeeding.  Nipples that are not sore, cracked, or bleeding. Signs that your baby is getting enough milk  Wetting at least 1-2 diapers during the first 24 hours after birth.  Wetting at least 5-6 diapers every 24 hours for the first week after birth. The urine should be clear or pale yellow by the age of 5 days.  Wetting 6-8 diapers every 24 hours as your baby continues to grow and develop.  At least 3 stools in a 24-hour period by the age of 5 days. The stool should be soft and yellow.  At least 3 stools in a 24-hour period by the age of 7 days. The stool should be seedy and yellow.  No loss of weight greater than 10% of birth weight during the first 3 days of life.  Average weight gain of 4-7 oz (113-198 g) per week after the age of 4 days.  Consistent daily weight gain by the age of 5 days, without weight loss after  the age of 2 weeks. After a feeding, your baby may spit up a small amount of milk. This is normal. Breastfeeding frequency and duration Frequent feeding will help you make more milk and can prevent sore nipples and extremely full breasts (breast engorgement). Breastfeed when you feel the need to reduce the fullness of your breasts or when your baby shows signs of hunger. This is called "breastfeeding on demand." Signs that your baby is hungry include:  Increased alertness, activity, or restlessness.  Movement of the head from side to  side.  Opening of the mouth when the corner of the mouth or cheek is stroked (rooting).  Increased sucking sounds, smacking lips, cooing, sighing, or squeaking.  Hand-to-mouth movements and sucking on fingers or hands.  Fussing or crying. Avoid introducing a pacifier to your baby in the first 4-6 weeks after your baby is born. After this time, you may choose to use a pacifier. Research has shown that pacifier use during the first year of a baby's life decreases the risk of sudden infant death syndrome (SIDS). Allow your baby to feed on each breast as long as he or she wants. When your baby unlatches or falls asleep while feeding from the first breast, offer the second breast. Because newborns are often sleepy in the first few weeks of life, you may need to awaken your baby to get him or her to feed. Breastfeeding times will vary from baby to baby. However, the following rules can serve as a guide to help you make sure that your baby is properly fed:  Newborns (babies 4 weeks of age or younger) may breastfeed every 1-3 hours.  Newborns should not go without breastfeeding for longer than 3 hours during the day or 5 hours during the night.  You should breastfeed your baby a minimum of 8 times in a 24-hour period. Breast milk pumping Pumping and storing breast milk allows you to make sure that your baby is exclusively fed your breast milk, even at times when you are unable to breastfeed. This is especially important if you go back to work while you are still breastfeeding, or if you are not able to be present during feedings. Your lactation consultant can help you find a method of pumping that works best for you and give you guidelines about how long it is safe to store breast milk.      Caring for your breasts while you breastfeed Nipples can become dry, cracked, and sore while breastfeeding. The following recommendations can help keep your breasts moisturized and healthy:  Avoid using soap on  your nipples.  Wear a supportive bra designed especially for nursing. Avoid wearing underwire-style bras or extremely tight bras (sports bras).  Air-dry your nipples for 3-4 minutes after each feeding.  Use only cotton bra pads to absorb leaked breast milk. Leaking of breast milk between feedings is normal.  Use lanolin on your nipples after breastfeeding. Lanolin helps to maintain your skin's normal moisture barrier. Pure lanolin is not harmful (not toxic) to your baby. You may also hand express a few drops of breast milk and gently massage that milk into your nipples and allow the milk to air-dry. In the first few weeks after giving birth, some women experience breast engorgement. Engorgement can make your breasts feel heavy, warm, and tender to the touch. Engorgement peaks within 3-5 days after you give birth. The following recommendations can help to ease engorgement:  Completely empty your breasts while breastfeeding or pumping. You may   want to start by applying warm, moist heat (in the shower or with warm, water-soaked hand towels) just before feeding or pumping. This increases circulation and helps the milk flow. If your baby does not completely empty your breasts while breastfeeding, pump any extra milk after he or she is finished.  Apply ice packs to your breasts immediately after breastfeeding or pumping, unless this is too uncomfortable for you. To do this: ? Put ice in a plastic bag. ? Place a towel between your skin and the bag. ? Leave the ice on for 20 minutes, 2-3 times a day.  Make sure that your baby is latched on and positioned properly while breastfeeding. If engorgement persists after 48 hours of following these recommendations, contact your health care provider or a lactation consultant. Overall health care recommendations while breastfeeding  Eat 3 healthy meals and 3 snacks every day. Well-nourished mothers who are breastfeeding need an additional 450-500 calories a day.  You can meet this requirement by increasing the amount of a balanced diet that you eat.  Drink enough water to keep your urine pale yellow or clear.  Rest often, relax, and continue to take your prenatal vitamins to prevent fatigue, stress, and low vitamin and mineral levels in your body (nutrient deficiencies).  Do not use any products that contain nicotine or tobacco, such as cigarettes and e-cigarettes. Your baby may be harmed by chemicals from cigarettes that pass into breast milk and exposure to secondhand smoke. If you need help quitting, ask your health care provider.  Avoid alcohol.  Do not use illegal drugs or marijuana.  Talk with your health care provider before taking any medicines. These include over-the-counter and prescription medicines as well as vitamins and herbal supplements. Some medicines that may be harmful to your baby can pass through breast milk.  It is possible to become pregnant while breastfeeding. If birth control is desired, ask your health care provider about options that will be safe while breastfeeding your baby. Where to find more information: La [employer]: www.llli.org Contact a health care provider if:  You feel like you want to stop breastfeeding or have become frustrated with breastfeeding.  Your nipples are cracked or bleeding.  Your breasts are red, tender, or warm.  You have: ? Painful breasts or nipples. ? A swollen area on either breast. ? A fever or chills. ? Nausea or vomiting. ? Drainage other than breast milk from your nipples.  Your breasts do not become full before feedings by the fifth day after you give birth.  You feel sad and depressed.  Your baby is: ? Too sleepy to eat well. ? Having trouble sleeping. ? More than 1 week old and wetting fewer than 6 diapers in a 24-hour period. ? Not gaining weight by 5 days of age.  Your baby has fewer than 3 stools in a 24-hour period.  Your baby's skin or the white  parts of his or her eyes become yellow. Get help right away if:  Your baby is overly tired (lethargic) and does not want to wake up and feed.  Your baby develops an unexplained fever. Summary  Breastfeeding offers many health benefits for infant and mothers.  Try to breastfeed your infant when he or she shows early signs of hunger.  Gently tickle or stroke your baby's lips with your finger or nipple to allow the baby to open his or her mouth. Bring the baby to your breast. Make sure that much of the areola   is in your baby's mouth. Offer one side and burp the baby before you offer the other side.  Talk with your health care provider or lactation consultant if you have questions or you face problems as you breastfeed. This information is not intended to replace advice given to you by your health care provider. Make sure you discuss any questions you have with your health care provider. Document Revised: 10/29/2017 Document Reviewed: 09/05/2016 Elsevier Patient Education  2021 [employer].

## 2020-12-12 ENCOUNTER — Other Ambulatory Visit: Payer: Self-pay

## 2020-12-12 ENCOUNTER — Ambulatory Visit (INDEPENDENT_AMBULATORY_CARE_PROVIDER_SITE_OTHER): Payer: Commercial Managed Care - PPO | Admitting: Obstetrics and Gynecology

## 2020-12-12 ENCOUNTER — Encounter: Payer: Self-pay | Admitting: Obstetrics and Gynecology

## 2020-12-12 VITALS — BP 115/80 | HR 93 | Wt 120.0 lb

## 2020-12-12 DIAGNOSIS — Z6791 Unspecified blood type, Rh negative: Secondary | ICD-10-CM

## 2020-12-12 DIAGNOSIS — Z3A17 17 weeks gestation of pregnancy: Secondary | ICD-10-CM

## 2020-12-12 DIAGNOSIS — O26899 Other specified pregnancy related conditions, unspecified trimester: Secondary | ICD-10-CM

## 2020-12-12 DIAGNOSIS — Z3402 Encounter for supervision of normal first pregnancy, second trimester: Secondary | ICD-10-CM | POA: Diagnosis not present

## 2020-12-12 DIAGNOSIS — Z3482 Encounter for supervision of other normal pregnancy, second trimester: Secondary | ICD-10-CM

## 2020-12-12 LAB — POCT URINALYSIS DIPSTICK OB
Bilirubin, UA: NEGATIVE
Blood, UA: NEGATIVE
Glucose, UA: NEGATIVE
Ketones, UA: NEGATIVE
Leukocytes, UA: NEGATIVE
Nitrite, UA: NEGATIVE
POC,PROTEIN,UA: NEGATIVE
Spec Grav, UA: 1.01 (ref 1.010–1.025)
Urobilinogen, UA: 0.2 E.U./dL
pH, UA: 7 (ref 5.0–8.0)

## 2020-12-12 NOTE — Progress Notes (Signed)
OB-Pt present for routine prenatal care. Pt stated that she was doing well no problems.  

## 2020-12-12 NOTE — Progress Notes (Signed)
ROB: Doing well, no major complaints.  Discussed breastfeeding, patient desires to perform.  Is Rh neg, will need Rhogam at 28 weeks.  Had normal genetic screen. Declines AFP. Has anatomy scan scheduled. Discussed patient's concerns about not being able to get paid maternity leave from her job and may have financial difficulties after pregnancy. Will have patient discuss other needs with Child psychotherapist. RTC in 4 weeks.   The patient has Medicaid.  [facility] Medicaid Risk Screening Form completed today   The following were addressed during this visit:  Breastfeeding Education - The importance of exclusive breastfeeding    Comments: Provides antibodies, Lower risk of breast and ovarian cancers, and type-2 diabetes,Helps your body recover, Reduced chance of SIDS.   - Exclusive breastfeeding for the first 6 months    Comments: Builds a healthy milk supply and keeps it up, protects baby from sickness and disease, and breastmilk has everything your baby needs for the first 6 months.

## 2020-12-21 ENCOUNTER — Ambulatory Visit: Payer: Medicaid Other

## 2021-01-09 ENCOUNTER — Other Ambulatory Visit: Payer: Self-pay

## 2021-01-09 ENCOUNTER — Ambulatory Visit (INDEPENDENT_AMBULATORY_CARE_PROVIDER_SITE_OTHER): Payer: Medicaid Other | Admitting: Obstetrics and Gynecology

## 2021-01-09 ENCOUNTER — Encounter: Payer: Self-pay | Admitting: Obstetrics and Gynecology

## 2021-01-09 VITALS — BP 110/66 | HR 97 | Wt 125.4 lb

## 2021-01-09 DIAGNOSIS — Z3402 Encounter for supervision of normal first pregnancy, second trimester: Secondary | ICD-10-CM

## 2021-01-09 DIAGNOSIS — Z3A21 21 weeks gestation of pregnancy: Secondary | ICD-10-CM

## 2021-01-09 LAB — POCT URINALYSIS DIPSTICK OB
Bilirubin, UA: NEGATIVE
Blood, UA: NEGATIVE
Glucose, UA: NEGATIVE
Ketones, UA: NEGATIVE
Leukocytes, UA: NEGATIVE
Nitrite, UA: NEGATIVE
POC,PROTEIN,UA: NEGATIVE
Spec Grav, UA: <=1.005 — AB (ref 1.010–1.025)
Urobilinogen, UA: 0.2 E.U./dL
pH, UA: 7.5 (ref 5.0–8.0)

## 2021-01-09 NOTE — Progress Notes (Signed)
ROB: No complaints.  Reports fetal movement.  Taking vitamins as directed.

## 2021-01-10 ENCOUNTER — Ambulatory Visit
Admission: RE | Admit: 2021-01-10 | Discharge: 2021-01-10 | Disposition: A | Discharge status: Home / Self Care | Payer: Commercial Managed Care - PPO | Source: Ambulatory Visit | Attending: Obstetrics and Gynecology | Admitting: Obstetrics and Gynecology

## 2021-01-10 DIAGNOSIS — Z369 Encounter for antenatal screening, unspecified: Secondary | ICD-10-CM | POA: Insufficient documentation

## 2021-01-10 DIAGNOSIS — Z3A19 19 weeks gestation of pregnancy: Secondary | ICD-10-CM | POA: Diagnosis not present

## 2021-01-10 DIAGNOSIS — Z3A13 13 weeks gestation of pregnancy: Secondary | ICD-10-CM

## 2021-02-05 NOTE — Patient Instructions (Signed)
 1-Hour Glucose  No dessert the night before No sweet drinks the day of- soda, fruit juice, sweet tea No sweet breakfast- pancakes, donuts May have mostly protein- egg, bacon, wheat toast, black coffee.               Grilled chicken, salad, vegetable, water.       3-Hour Glucose Test  Must be fasting.  Nothing to eat or drink after midnight.  May have morning medication with a sip of water.     Common Medications Safe in Pregnancy  Acne:      Constipation:  Benzoyl Peroxide     Colace  Clindamycin      Dulcolax Suppository  Topica Erythromycin     Fibercon  Salicylic Acid      Metamucil         Miralax AVOID:        Senakot   Accutane    Cough:  Retin-A       Cough Drops  Tetracycline      Phenergan w/ Codeine if Rx  Minocycline      Robitussin (Plain & DM)  Antibiotics:     Crabs/Lice:  Ceclor       RID  Cephalosporins    AVOID:  E-Mycins      Kwell  Keflex  Macrobid/Macrodantin   Diarrhea:  Penicillin      Kao-Pectate  Zithromax      Imodium AD         PUSH FLUIDS AVOID:       Cipro     Fever:  Tetracycline      Tylenol (Regular or Extra  Minocycline       Strength)  Levaquin      Extra Strength-Do not          Exceed 8 tabs/24 hrs Caffeine:        <200mg/day (equiv. To 1 cup of coffee or  approx. 3 12 oz sodas)         Gas: Cold/Hayfever:       Gas-X  Benadryl      Mylicon  Claritin       Phazyme  **Claritin-D        Chlor-Trimeton    Headaches:  Dimetapp      ASA-Free Excedrin  Drixoral-Non-Drowsy     Cold Compress  Mucinex (Guaifenasin)     Tylenol (Regular or Extra  Sudafed/Sudafed-12 Hour     Strength)  **Sudafed PE Pseudoephedrine   Tylenol Cold & Sinus     Vicks Vapor Rub  Zyrtec  **AVOID if Problems With Blood Pressure         Heartburn: Avoid lying down for at least 1 hour after meals  Aciphex      Maalox     Rash:  Milk of Magnesia     Benadryl    Mylanta       1% Hydrocortisone Cream  Pepcid  Pepcid Complete   Sleep  Aids:  Prevacid      Ambien   Prilosec       Benadryl  Rolaids       Chamomile Tea  Tums (Limit 4/day)     Unisom         Tylenol PM         Warm milk-add vanilla or  Hemorrhoids:       Sugar for taste  Anusol/Anusol H.C.  (RX: Analapram 2.5%)  Sugar Substitutes:  Hydrocortisone OTC     Ok in moderation    Preparation H      Tucks        Vaseline lotion applied to tissue with wiping    Herpes:     Throat:  Acyclovir      Oragel  Famvir  Valtrex     Vaccines:         Flu Shot Leg Cramps:       *Gardasil  Benadryl      Hepatitis A         Hepatitis B Nasal Spray:       Pneumovax  Saline Nasal Spray     Polio Booster         Tetanus Nausea:       Tuberculosis test or PPD  Vitamin B6 25 mg TID   AVOID:    Dramamine      *Gardasil  Emetrol       Live Poliovirus  Ginger Root 250 mg QID    MMR (measles, mumps &  High Complex Carbs @ Bedtime    rebella)  Sea Bands-Accupressure    Varicella (Chickenpox)  Unisom 1/2 tab TID     *No known complications           If received before Pain:         Known pregnancy;   Darvocet       Resume series after  Lortab        Delivery  Percocet    Yeast:   Tramadol      Femstat  Tylenol 3      Gyne-lotrimin  Ultram       Monistat  Vicodin           MISC:         All Sunscreens           Hair Coloring/highlights          Insect Repellant's          (Including DEET)         Mystic Tans  

## 2021-02-06 ENCOUNTER — Encounter: Payer: Self-pay | Admitting: Obstetrics and Gynecology

## 2021-02-06 ENCOUNTER — Other Ambulatory Visit: Payer: Self-pay

## 2021-02-06 ENCOUNTER — Ambulatory Visit (INDEPENDENT_AMBULATORY_CARE_PROVIDER_SITE_OTHER): Payer: Medicaid Other | Admitting: Obstetrics and Gynecology

## 2021-02-06 VITALS — BP 101/65 | HR 109 | Wt 133.3 lb

## 2021-02-06 DIAGNOSIS — Z3A24 24 weeks gestation of pregnancy: Secondary | ICD-10-CM

## 2021-02-06 DIAGNOSIS — Z3402 Encounter for supervision of normal first pregnancy, second trimester: Secondary | ICD-10-CM

## 2021-02-06 LAB — POCT URINALYSIS DIPSTICK OB
Bilirubin, UA: NEGATIVE
Blood, UA: NEGATIVE
Glucose, UA: NEGATIVE
Ketones, UA: NEGATIVE
Nitrite, UA: NEGATIVE
POC,PROTEIN,UA: NEGATIVE
Spec Grav, UA: <=1.005 — AB (ref 1.010–1.025)
Urobilinogen, UA: 0.2 E.U./dL
pH, UA: 7 (ref 5.0–8.0)

## 2021-02-06 NOTE — Progress Notes (Signed)
ROB: Doing well, no complaints. Normal anatomy scan. For 28 week labs next visit and Rhogam. RTC in 4 weeks. Discussed breastfeeding topics.   The following were addressed during this visit:  Breastfeeding Education - Early initiation of breastfeeding    Comments: Keeps milk supply adequate, helps contract uterus and slow bleeding, and early milk is the perfect first food and is easy to digest.   - Risks of giving your baby anything other than breast milk if you are breastfeeding    Comments: Make the baby less content with breastfeeds, may make my baby more susceptible to illness, and may reduce my milk supply.   - The importance of early skin-to-skin contact    Comments:  Keeps baby warm and secure, helps keep baby's blood sugar up and breathing steady, easier to bond and breastfeed, and helps calm baby.  - Rooming-in on a 24-hour basis    Comments: Easier to learn baby's feeding cues, easier to bond and get to know each other, and encourages milk production.   - Effective positioning and attachment    Comments: Helps my baby to get enough breast milk, helps to produce an adequate milk supply, and helps prevent nipple pain and damage

## 2021-02-06 NOTE — Progress Notes (Signed)
OB-Pt present routine prenatal care. Pt stated that she was doing well.

## 2021-03-12 ENCOUNTER — Ambulatory Visit (INDEPENDENT_AMBULATORY_CARE_PROVIDER_SITE_OTHER): Payer: Medicaid Other | Admitting: Obstetrics and Gynecology

## 2021-03-12 ENCOUNTER — Encounter: Payer: Self-pay | Admitting: Obstetrics and Gynecology

## 2021-03-12 ENCOUNTER — Other Ambulatory Visit: Payer: Self-pay

## 2021-03-12 VITALS — BP 111/67 | HR 101 | Wt 141.7 lb

## 2021-03-12 DIAGNOSIS — Z3402 Encounter for supervision of normal first pregnancy, second trimester: Secondary | ICD-10-CM

## 2021-03-12 DIAGNOSIS — Z3A29 29 weeks gestation of pregnancy: Secondary | ICD-10-CM | POA: Diagnosis not present

## 2021-03-12 DIAGNOSIS — Z23 Encounter for immunization: Secondary | ICD-10-CM

## 2021-03-12 MED ORDER — RHO D IMMUNE GLOBULIN 1500 UNIT/2ML IJ SOSY
300.0000 ug | PREFILLED_SYRINGE | Freq: Once | INTRAMUSCULAR | Status: AC
  Administered 2021-03-12: 300 ug via INTRAMUSCULAR

## 2021-03-12 NOTE — Addendum Note (Signed)
Addended by: Smith Mince on: 03/12/2021 11:02 AM   Modules accepted: Orders

## 2021-03-12 NOTE — Addendum Note (Signed)
Addended by: Dorian Pod on: 03/12/2021 01:46 PM   Modules accepted: Orders

## 2021-03-12 NOTE — Progress Notes (Signed)
ROB: 1 hour GCT today.  Patient has no complaints.  Reports active fetal movement.

## 2021-03-13 LAB — CBC
Hematocrit: 30.3 % — ABNORMAL LOW (ref 34.0–46.6)
Hemoglobin: 9.9 g/dL — ABNORMAL LOW (ref 11.1–15.9)
MCH: 28.7 pg (ref 26.6–33.0)
MCHC: 32.7 g/dL (ref 31.5–35.7)
MCV: 88 fL (ref 79–97)
Platelets: 210 10*3/uL (ref 150–450)
RBC: 3.45 x10E6/uL — ABNORMAL LOW (ref 3.77–5.28)
RDW: 11.6 % — ABNORMAL LOW (ref 11.7–15.4)
WBC: 9.5 10*3/uL (ref 3.4–10.8)

## 2021-03-13 LAB — RPR: RPR Ser Ql: NONREACTIVE

## 2021-03-13 LAB — GLUCOSE, 1 HOUR GESTATIONAL: Gestational Diabetes Screen: 92 mg/dL (ref 65–139)

## 2021-04-03 ENCOUNTER — Encounter: Payer: Medicaid Other | Admitting: Obstetrics and Gynecology

## 2021-04-04 ENCOUNTER — Ambulatory Visit (INDEPENDENT_AMBULATORY_CARE_PROVIDER_SITE_OTHER): Payer: Medicaid Other | Admitting: Obstetrics and Gynecology

## 2021-04-04 ENCOUNTER — Encounter: Payer: Self-pay | Admitting: Obstetrics and Gynecology

## 2021-04-04 ENCOUNTER — Other Ambulatory Visit: Payer: Self-pay

## 2021-04-04 VITALS — BP 108/66 | HR 102 | Wt 146.8 lb

## 2021-04-04 DIAGNOSIS — O479 False labor, unspecified: Secondary | ICD-10-CM

## 2021-04-04 DIAGNOSIS — Z3A33 33 weeks gestation of pregnancy: Secondary | ICD-10-CM

## 2021-04-04 DIAGNOSIS — Z3483 Encounter for supervision of other normal pregnancy, third trimester: Secondary | ICD-10-CM

## 2021-04-04 LAB — POCT URINALYSIS DIPSTICK OB
Bilirubin, UA: NEGATIVE
Blood, UA: NEGATIVE
Glucose, UA: NEGATIVE
Ketones, UA: NEGATIVE
Leukocytes, UA: NEGATIVE
Nitrite, UA: NEGATIVE
POC,PROTEIN,UA: NEGATIVE
Spec Grav, UA: 1.01 (ref 1.010–1.025)
Urobilinogen, UA: 0.2 E.U./dL
pH, UA: 7.5 (ref 5.0–8.0)

## 2021-04-04 NOTE — Patient Instructions (Addendum)
Third Trimester of Pregnancy  The third trimester of pregnancy is from week 28 through week 59. This is also called months 7 through 9. This trimester is when your unborn baby (fetus) is growing very fast. At the end of the ninth month, the unborn baby is about20 inches long. It weighs about 6-10 pounds. Body changes during your third trimester Your body continues to go through many changes during this time. The changesvary and generally return to normal after the baby is born. Physical changes Your weight will continue to increase. You may gain 25-35 pounds (11-16 kg) by the end of the pregnancy. If you are underweight, you may gain 28-40 lb (about 13-18 kg). If you are overweight, you may gain 15-25 lb (about 7-11 kg). You may start to get stretch marks on your hips, belly (abdomen), and breasts. Your breasts will continue to grow and may hurt. A yellow fluid (colostrum) may leak from your breasts. This is the first milk you are making for your baby. You may have changes in your hair. Your belly button may stick out. You may have more swelling in your hands, face, or ankles. Health changes You may have heartburn. You may have trouble pooping (constipation). You may get hemorrhoids. These are swollen veins in the butt that can itch or get painful. You may have swollen veins (varicose veins) in your legs. You may have more body aches in the pelvis, back, or thighs. You may have more tingling or numbness in your hands, arms, and legs. The skin on your belly may also feel numb. You may feel short of breath as your womb (uterus) gets bigger. Other changes You may pee (urinate) more often. You may have more problems sleeping. You may notice the unborn baby "dropping," or moving lower in your belly. You may have more discharge coming from your vagina. Your joints may feel loose, and you may have pain around your pelvic bone. Follow these instructions at home: Medicines Take over-the-counter  and prescription medicines only as told by your doctor. Some medicines are not safe during pregnancy. Take a prenatal vitamin that contains at least 600 micrograms (mcg) of folic acid. Eating and drinking Eat healthy meals that include: Fresh fruits and vegetables. Whole grains. Good sources of protein, such as meat, eggs, or tofu. Low-fat dairy products. Avoid raw meat and unpasteurized juice, milk, and cheese. These carry germs that can harm you and your baby. Eat 4 or 5 small meals rather than 3 large meals a day. You may need to take these actions to prevent or treat trouble pooping: Drink enough fluids to keep your pee (urine) pale yellow. Eat foods that are high in fiber. These include beans, whole grains, and fresh fruits and vegetables. Limit foods that are high in fat and sugar. These include fried or sweet foods. Activity Exercise only as told by your doctor. Stop exercising if you start to have cramps in your womb. Avoid heavy lifting. Do not exercise if it is too hot or too humid, or if you are in a place of great height (high altitude). If you choose to, you may have sex unless your doctor tells you not to. Relieving pain and discomfort Take breaks often, and rest with your legs raised (elevated) if you have leg cramps or low back pain. Take warm water baths (sitz baths) to soothe pain or discomfort caused by hemorrhoids. Use hemorrhoid cream if your doctor approves. Wear a good support bra if your breasts are tender. If  you develop bulging, swollen veins in your legs: Wear support hose as told by your doctor. Raise your feet for 15 minutes, 3-4 times a day. Limit salt in your food. Safety Talk to your doctor before traveling far distances. Do not use hot tubs, steam rooms, or saunas. Wear your seat belt at all times when you are in a car. Talk with your doctor if someone is hurting you or yelling at you a lot. Preparing for your baby's arrival To prepare for the arrival  of your baby: Take prenatal classes. Visit the hospital and tour the maternity area. Buy a rear-facing car seat. Learn how to install it in your car. Prepare the baby's room. Take out all pillows and stuffed animals from the baby's crib. General instructions Avoid cat litter boxes and soil used by cats. These carry germs that can cause harm to the baby and can cause a loss of your baby by miscarriage or stillbirth. Do not douche or use tampons. Do not use scented sanitary pads. Do not smoke or use any products that contain nicotine or tobacco. If you need help quitting, ask your doctor. Do not drink alcohol. Do not use herbal medicines, illegal drugs, or medicines that were not approved by your doctor. Chemicals in these products can affect your baby. Keep all follow-up visits. This is important. Where to find more information American Pregnancy Association: americanpregnancy.org [employer]: www.acog.org Office on Women's Health: KeywordPortfolios.com.br Contact a doctor if: You have a fever. You have mild cramps or pressure in your lower belly. You have a nagging pain in your belly area. You vomit, or you have watery poop (diarrhea). You have bad-smelling fluid coming from your vagina. You have pain when you pee, or your pee smells bad. You have a headache that does not go away when you take medicine. You have changes in how you see, or you see spots in front of your eyes. Get help right away if: Your water breaks. You have regular contractions that are less than 5 minutes apart. You are spotting or bleeding from your vagina. You have very bad belly cramps or pain. You have trouble breathing. You have chest pain. You faint. You have not felt the baby move for the amount of time told by your doctor. You have new or increased pain, swelling, or redness in an arm or leg. Summary The third trimester is from week 28 through week 40 (months 7  through 9). This is the time when your unborn baby is growing very fast. During this time, your discomfort may increase as you gain weight and as your baby grows. Get ready for your baby to arrive by taking prenatal classes, buying a rear-facing car seat, and preparing the baby's room. Get help right away if you are bleeding from your vagina, you have chest pain and trouble breathing, or you have not felt the baby move for the amount of time told by your doctor. This information is not intended to replace advice given to you by your health care provider. Make sure you discuss any questions you have with your healthcare provider. Document Revised: 01/11/2020 Document Reviewed: 11/17/2019 Elsevier Patient Education  2022 [employer].        [facility]  [place], [place], [place] [address]  Phone: [phone]  [facility] (second location)  [address] [place], [place] [address]  Phone: [phone]  [facility]) [place]  [address] [place], [place] [address] Phone: [phone]  [address] [place],  [address]  Phone: [phone]     Common Medications Safe in Pregnancy  Acne:      Constipation:  Benzoyl Peroxide     Colace  Clindamycin      Dulcolax Suppository  Topica Erythromycin     Fibercon  Salicylic Acid      Metamucil         Miralax AVOID:        Senakot   Accutane    Cough:  Retin-A       Cough Drops  Tetracycline      Phenergan w/ Codeine if Rx  Minocycline      Robitussin (Plain & DM)  Antibiotics:     Crabs/Lice:  Ceclor       RID  Cephalosporins    AVOID:  E-Mycins      Kwell  Keflex  Macrobid/Macrodantin   Diarrhea:  Penicillin      Kao-Pectate  Zithromax      Imodium AD         PUSH FLUIDS AVOID:       Cipro     Fever:  Tetracycline      Tylenol (Regular or Extra  Minocycline       Strength)  Levaquin      Extra Strength-Do  not          Exceed 8 tabs/24 hrs Caffeine:        '200mg'$ /day (equiv. To 1 cup of coffee or  approx. 3 12 oz sodas)         Gas: Cold/Hayfever:       Gas-X  Benadryl      Mylicon  Claritin       Phazyme  **Claritin-D        Chlor-Trimeton    Headaches:  Dimetapp      ASA-Free Excedrin  Drixoral-Non-Drowsy     Cold Compress  Mucinex (Guaifenasin)     Tylenol (Regular or Extra  Sudafed/Sudafed-12 Hour     Strength)  **Sudafed PE Pseudoephedrine   Tylenol Cold & Sinus     Vicks Vapor Rub  Zyrtec  **AVOID if Problems With Blood Pressure         Heartburn: Avoid lying down for at least 1 hour after meals  Aciphex      Maalox     Rash:  Milk of Magnesia     Benadryl    Mylanta       1% Hydrocortisone Cream  Pepcid  Pepcid Complete   Sleep Aids:  Prevacid      Ambien   Prilosec       Benadryl  Rolaids       Chamomile Tea  Tums (Limit 4/day)     Unisom         Tylenol PM         Warm milk-add vanilla or  Hemorrhoids:       Sugar for taste  Anusol/Anusol H.C.  (RX: Analapram 2.5%)  Sugar Substitutes:  Hydrocortisone OTC     Ok in moderation  Preparation H      Tucks        Vaseline lotion applied to tissue with wiping    Herpes:     Throat:  Acyclovir      Oragel  Famvir  Valtrex     Vaccines:         Flu Shot Leg Cramps:       *  Gardasil  Benadryl      Hepatitis A         Hepatitis B Nasal Spray:       Pneumovax  Saline Nasal Spray     Polio Booster         Tetanus Nausea:       Tuberculosis test or PPD  Vitamin B6 25 mg TID   AVOID:    Dramamine      *Gardasil  Emetrol       Live Poliovirus  Ginger Root 250 mg QID    MMR (measles, mumps &  High Complex Carbs @ Bedtime    rebella)  Sea Bands-Accupressure    Varicella (Chickenpox)  Unisom 1/2 tab TID     *No known complications           If received before Pain:         Known pregnancy;   Darvocet       Resume series after  Lortab        Delivery  Percocet    Yeast:   Tramadol      Femstat  Tylenol  3      Gyne-lotrimin  Ultram       Monistat  Vicodin           MISC:         All Sunscreens           Hair Coloring/highlights          Insect Repellant's          (Including DEET)         Mystic Tans

## 2021-04-04 NOTE — Progress Notes (Signed)
ROB: Noting [employer]. Otherwise doing well. Discussed pain management in labor, desires natural labor as much as possible. Discussed birth plan.  Discussed Pediatrician List. Desires circumcision for female infant. RTC in 2 weeks.

## 2021-04-04 NOTE — Progress Notes (Signed)
OB-Pt present for routine prenatal care. Pt stated having cramping in the abd area and braxton hick contractions.

## 2021-04-17 ENCOUNTER — Ambulatory Visit (INDEPENDENT_AMBULATORY_CARE_PROVIDER_SITE_OTHER): Payer: Medicaid Other | Admitting: Obstetrics and Gynecology

## 2021-04-17 ENCOUNTER — Encounter: Payer: Self-pay | Admitting: Obstetrics and Gynecology

## 2021-04-17 ENCOUNTER — Other Ambulatory Visit: Payer: Self-pay

## 2021-04-17 VITALS — BP 117/74 | HR 114 | Wt 151.7 lb

## 2021-04-17 DIAGNOSIS — Z3A35 35 weeks gestation of pregnancy: Secondary | ICD-10-CM

## 2021-04-17 DIAGNOSIS — Z3403 Encounter for supervision of normal first pregnancy, third trimester: Secondary | ICD-10-CM

## 2021-04-17 LAB — POCT URINALYSIS DIPSTICK OB
Blood, UA: NEGATIVE
Glucose, UA: NEGATIVE
Leukocytes, UA: NEGATIVE
POC,PROTEIN,UA: NEGATIVE
Spec Grav, UA: 1.01 (ref 1.010–1.025)
pH, UA: 7 (ref 5.0–8.0)

## 2021-04-17 NOTE — Progress Notes (Signed)
ROB: She is doing well today, no new concerns. 

## 2021-04-17 NOTE — Progress Notes (Signed)
ROB: Reports daily fetal movement.  Denies contractions.  Signs and symptoms of labor discussed.  Cultures next week discussed.  Possible breech presentation.  Seems like a good candidate for ECV if necessary.  Confirm position at next visit.

## 2021-04-23 ENCOUNTER — Ambulatory Visit (INDEPENDENT_AMBULATORY_CARE_PROVIDER_SITE_OTHER): Payer: Medicaid Other | Admitting: Obstetrics and Gynecology

## 2021-04-23 ENCOUNTER — Encounter: Payer: Self-pay | Admitting: Obstetrics and Gynecology

## 2021-04-23 ENCOUNTER — Other Ambulatory Visit: Payer: Self-pay

## 2021-04-23 VITALS — BP 116/76 | HR 92 | Wt 150.8 lb

## 2021-04-23 DIAGNOSIS — Z3A36 36 weeks gestation of pregnancy: Secondary | ICD-10-CM

## 2021-04-23 DIAGNOSIS — Z3403 Encounter for supervision of normal first pregnancy, third trimester: Secondary | ICD-10-CM

## 2021-04-23 LAB — POCT URINALYSIS DIPSTICK OB
Bilirubin, UA: NEGATIVE
Blood, UA: NEGATIVE
Clarity, UA: NEGATIVE
Glucose, UA: NEGATIVE
Ketones, UA: NEGATIVE
Leukocytes, UA: NEGATIVE
Nitrite, UA: NEGATIVE
POC,PROTEIN,UA: NEGATIVE
Spec Grav, UA: <=1.005 — AB (ref 1.010–1.025)
Urobilinogen, UA: 0.2 E.U./dL
pH, UA: 7.5 (ref 5.0–8.0)

## 2021-04-23 NOTE — Patient Instructions (Addendum)
Signs and Symptoms of Labor Labor is the body's natural process of moving the baby and the placenta out of the uterus. The process of labor usually starts when the baby is full-term, between 37 and 40 weeks of pregnancy. Signs and symptoms that you are close to going into labor As your body prepares for labor and the birth of your baby, you may notice the following symptoms in the weeks and days before true labor starts: Passing a small amount of thick, bloody mucus from your vagina. This is called normal bloody show or losing your mucus plug. This may happen more than a week before labor begins, or right before labor begins, as the opening of the cervix starts to widen (dilate). For some women, the entire mucus plug passes at once. For others, pieces of the mucus plug may gradually pass over several days. Your baby moving (dropping) lower in your pelvis to get into position for birth (lightening). When this happens, you may feel more pressure on your bladder and pelvic bone and less pressure on your ribs. This may make it easier to breathe. It may also cause you to need to urinate more often and have problems with bowel movements. Having "practice contractions," also called Braxton Hicks contractions or false labor. These occur at irregular (unevenly spaced) intervals that are more than 10 minutes apart. False labor contractions are common after exercise or sexual activity. They will stop if you change position, rest, or drink fluids. These contractions are usually mild and do not get stronger over time. They may feel like: A backache or back pain. Mild cramps, similar to menstrual cramps. Tightening or pressure in your abdomen. Other early symptoms include: Nausea or loss of appetite. Diarrhea. Having a sudden burst of energy, or feeling very tired. Mood changes. Having trouble sleeping. Signs and symptoms that labor has begun Signs that you are in labor may include: Having contractions that come  at regular (evenly spaced) intervals and increase in intensity. This may feel like more intense tightening or pressure in your abdomen that moves to your back. Contractions may also feel like rhythmic pain in your upper thighs or back that comes and goes at regular intervals. For first-time mothers, this change in intensity of contractions often occurs at a more gradual pace. Women who have given birth before may notice a more rapid progression of contraction changes. Feeling pressure in the vaginal area. Your water breaking (rupture of membranes). This is when the sac of fluid that surrounds your baby breaks. Fluid leaking from your vagina may be clear or blood-tinged. Labor usually starts within 24 hours of your water breaking, but it may take longer to begin. Some women may feel a sudden gush of fluid. Others notice that their underwear repeatedly becomes damp. Follow these instructions at home:  When labor starts, or if your water breaks, call your health care provider or nurse care line. Based on your situation, they will determine when you should go in for an exam. During early labor, you may be able to rest and manage symptoms at home. Some strategies to try at home include: Breathing and relaxation techniques. Taking a warm bath or shower. Listening to music. Using a heating pad on the lower back for pain. If you are directed to use heat: Place a towel between your skin and the heat source. Leave the heat on for 20-30 minutes. Remove the heat if your skin turns bright red. This is especially important if you are unable to   feel pain, heat, or cold. You may have a greater risk of getting burned. Contact a health care provider if: Your labor has started. Your water breaks. Get help right away if: You have painful, regular contractions that are 5 minutes apart or less. Labor starts before you are [redacted] weeks along in your pregnancy. You have a fever. You have bright red blood coming from  your vagina. You do not feel your baby moving. You have a severe headache with or without vision problems. You have severe nausea, vomiting, or diarrhea. You have chest pain or shortness of breath. These symptoms may represent a serious problem that is an emergency. Do not wait to see if the symptoms will go away. Get medical help right away. Call your local emergency services (911 in the [place] Do not drive yourself to the hospital. Summary Labor is your body's natural process of moving your baby and the placenta out of your uterus. The process of labor usually starts when your baby is full-term, between 37 and 40 weeks of pregnancy. When labor starts, or if your water breaks, call your health care provider or nurse care line. Based on your situation, they will determine when you should go in for an exam. This information is not intended to replace advice given to you by your health care provider. Make sure you discuss any questions you have with your health care provider. Document Revised: 05/26/2020 Document Reviewed: 05/26/2020 Elsevier Patient Education  2022 Elsevier Inc.  

## 2021-04-23 NOTE — Progress Notes (Signed)
   OB-Pt present for routine prenatal care. Pt stated fetal movement present; no contractions present; no vaginal bleeding and no changes in vaginal discharge.      

## 2021-04-23 NOTE — Progress Notes (Signed)
ROB: Patient doing well, no complaints. Concerned if baby is still breech. Unofficial presentation scan performed, noting vertex presentation. 36 week cultures performed RTC in 1-2 weeks.

## 2021-04-24 LAB — OB RESULTS CONSOLE GC/CHLAMYDIA: Gonorrhea: NEGATIVE

## 2021-04-26 LAB — STREP GP B NAA: Strep Gp B NAA: NEGATIVE

## 2021-04-27 LAB — GC/CHLAMYDIA PROBE AMP
Chlamydia trachomatis, NAA: NEGATIVE
Neisseria Gonorrhoeae by PCR: NEGATIVE

## 2021-05-07 ENCOUNTER — Ambulatory Visit (INDEPENDENT_AMBULATORY_CARE_PROVIDER_SITE_OTHER): Payer: Commercial Managed Care - PPO | Admitting: Obstetrics and Gynecology

## 2021-05-07 ENCOUNTER — Other Ambulatory Visit: Payer: Self-pay

## 2021-05-07 ENCOUNTER — Encounter: Payer: Self-pay | Admitting: Obstetrics and Gynecology

## 2021-05-07 VITALS — BP 105/71 | HR 116 | Wt 155.7 lb

## 2021-05-07 DIAGNOSIS — Z3403 Encounter for supervision of normal first pregnancy, third trimester: Secondary | ICD-10-CM

## 2021-05-07 DIAGNOSIS — Z3A37 37 weeks gestation of pregnancy: Secondary | ICD-10-CM

## 2021-05-07 DIAGNOSIS — Z23 Encounter for immunization: Secondary | ICD-10-CM | POA: Diagnosis not present

## 2021-05-07 LAB — POCT URINALYSIS DIPSTICK OB
Bilirubin, UA: NEGATIVE
Blood, UA: NEGATIVE
Glucose, UA: NEGATIVE
Ketones, UA: NEGATIVE
Leukocytes, UA: NEGATIVE
Nitrite, UA: NEGATIVE
POC,PROTEIN,UA: NEGATIVE
Spec Grav, UA: 1.01 (ref 1.010–1.025)
Urobilinogen, UA: 0.2 E.U./dL
pH, UA: 6.5 (ref 5.0–8.0)

## 2021-05-07 NOTE — Progress Notes (Signed)
ROB: No significant complaints. -Possibly some right rib/cartilage pain but intermittent and not disabling.  Denies contractions.  Culture results reviewed.

## 2021-05-07 NOTE — Progress Notes (Signed)
ROB: Doing well, no concerns. 

## 2021-05-15 ENCOUNTER — Other Ambulatory Visit: Payer: Self-pay

## 2021-05-15 ENCOUNTER — Ambulatory Visit (INDEPENDENT_AMBULATORY_CARE_PROVIDER_SITE_OTHER): Payer: Medicaid Other | Admitting: Obstetrics and Gynecology

## 2021-05-15 ENCOUNTER — Encounter: Payer: Self-pay | Admitting: Obstetrics and Gynecology

## 2021-05-15 VITALS — BP 106/73 | HR 108 | Wt 159.4 lb

## 2021-05-15 DIAGNOSIS — Z3A38 38 weeks gestation of pregnancy: Secondary | ICD-10-CM

## 2021-05-15 DIAGNOSIS — Z3403 Encounter for supervision of normal first pregnancy, third trimester: Secondary | ICD-10-CM

## 2021-05-15 DIAGNOSIS — O480 Post-term pregnancy: Secondary | ICD-10-CM

## 2021-05-15 LAB — POCT URINALYSIS DIPSTICK OB
Bilirubin, UA: NEGATIVE
Blood, UA: NEGATIVE
Glucose, UA: NEGATIVE
Ketones, UA: NEGATIVE
Leukocytes, UA: NEGATIVE
Nitrite, UA: NEGATIVE
POC,PROTEIN,UA: NEGATIVE
Spec Grav, UA: 1.01 (ref 1.010–1.025)
Urobilinogen, UA: 0.2 E.U./dL
pH, UA: 6.5 (ref 5.0–8.0)

## 2021-05-15 NOTE — Progress Notes (Signed)
ROB: She is doing well, no new concerns. 

## 2021-05-15 NOTE — Progress Notes (Signed)
ROB: Notes some soreness or pulling sensation near her right rib when bending over. Has noted some cramping but no contractions. Reiterated labor precautions. RTC in 1 week. For BPP/growth for postdates at next visit. Discussed IOL at 41 weeks, will schedule for 05/28/2021 at midnight.

## 2021-05-15 NOTE — Patient Instructions (Signed)
COVID 19 Instructions for Scheduled Procedure (Inductions/C-sections and GYN surgeries)   Thank you for choosing [facility] for your services.  You have been scheduled for a procedure called _____Induction of Labor_______________  Your procedure is scheduled on ___Tuesday, May 28, 2021 at midnight (arrive Monday night into Tuesday)___________________.  You are required to have COVID-19 testing performed 2 days prior to your scheduled procedure date.  Testing is performed between 9 AM and 1 PM Monday through Friday.  Please present for testing on _Friday, October _October 7, 2022____________ during this hour. Testing is performed in [facility] (this is next to the [employer]).    Upon your scheduled procedure date, you will need to arrive at the [facility] entrance. (There is a statue at the front of this entrance.)   Please arrive on time if you are scheduled for an induction of labor.   If you are scheduled for a Cesarean delivery or for Gyn Surgery, arrive 2 hours prior to your procedure time.   If you are an Obstetric patient and your arrival time falls between 11 PM and 6 AM call L&D [phone]) when you arrive.  A staff member will meet you at the [facility] entrance.  At this time, patients are allowed 1 support person and 2 additional visitors to accompany them. Face masks are required for you and your support person. Your support person is now allowed to be there with you during the entire time of your admission.   Please contact the office if you have any questions regarding this information.  The [facility] office number is [phone].     Thank you,    Your [facility] Providers     Labor Induction Labor induction is when steps are taken to cause a pregnant woman to begin the labor process. Most women go into labor on their own between 37 weeks and 42 weeks of pregnancy. When this does not happen, or when there is a medical need  for labor to begin, steps may be taken to induce, or bring on, labor. Labor induction causes a pregnant woman's uterus to contract. It also causes the cervix to soften (ripen), open (dilate), and thin out. Usually, labor is not induced before 39 weeks of pregnancy unless there is a medical reason to do so. When is labor induction considered? Labor induction may be right for you if: Your pregnancy lasts longer than 41 to 42 weeks. Your placenta is separating from your uterus (placental abruption). You have a rupture of membranes and your labor does not begin. You have health problems, like diabetes or high blood pressure (preeclampsia) during your pregnancy. Your baby has stopped growing or does not have enough amniotic fluid. Before labor induction begins, your health care provider will consider the following factors: Your medical condition and the baby's condition. How many weeks you have been pregnant. How mature the baby's lungs are. The condition of your cervix. The position of the baby. The size of your birth canal. Tell a health care provider about: Any allergies you have. All medicines you are taking, including vitamins, herbs, eye drops, creams, and over-the-counter medicines. Any problems you or your family members have had with anesthetic medicines. Any surgeries you have had. Any blood disorders you have. Any medical conditions you have. What are the risks? Generally, this is a safe procedure. However, problems may occur, including: Failed induction. Changes in fetal heart rate, such as being too high, too low, or  irregular (erratic). Infection in the mother or the baby. Increased risk of having a cesarean delivery. Breaking off (abruption) of the placenta from the uterus. This is rare. Rupture of the uterus. This is very rare. Your baby could fail to get enough blood flow or oxygen. This can be life-threatening. When induction is needed for medical reasons, the benefits  generally outweigh the risks. What happens during the procedure?   During the procedure, your health care provider will use one of these methods to induce labor: Stripping the membranes. In this method, the amniotic sac tissue is gently separated from the cervix. This causes the following to happen: Your cervix stretches, which in turn causes the release of prostaglandins. Prostaglandins induce labor and cause the uterus to contract. This procedure is often done in an office visit. You will be sent home to wait for contractions to begin. Prostaglandin medicine. This medicine starts contractions and causes the cervix to dilate and ripen. This can be taken by mouth (orally) or by being inserted into the vagina (suppository). Inserting a small, thin tube (catheter) with a balloon into the vagina and then expanding the balloon with water to dilate the cervix. Breaking the water. In this method, a small instrument is used to make a small hole in the amniotic sac. This eventually causes the amniotic sac to break. Contractions should begin within a few hours. Medicine to trigger or strengthen contractions. This medicine is given through an IV that is inserted into a vein in your arm. This procedure may vary among health care providers and hospitals. Where to find more information [employer]: www.marchofdimes.org [employer]st[employer]ians [employer]necologists: www.acog.org Summary Labor induction causes a pregnant woman's uterus to contract. It also causes the cervix to soften (ripen), open (dilate), and thin out. Labor is usually not induced before 39 weeks of pregnancy unless there is a medical reason to do so. When induction is needed for medical reasons, the benefits generally outweigh the risks. Talk with your health care provider about which methods of labor induction are right for you. This information is not intended to replace advice given to you by your health care provider. Make  sure you discuss any questions you have with your health care provider. Document Revised: 05/17/2020 Document Reviewed: 05/17/2020 Elsevier Patient Education  2022 [employer].

## 2021-05-23 ENCOUNTER — Ambulatory Visit (INDEPENDENT_AMBULATORY_CARE_PROVIDER_SITE_OTHER): Payer: Medicaid Other | Admitting: Obstetrics and Gynecology

## 2021-05-23 ENCOUNTER — Encounter: Payer: Self-pay | Admitting: Obstetrics and Gynecology

## 2021-05-23 ENCOUNTER — Ambulatory Visit (INDEPENDENT_AMBULATORY_CARE_PROVIDER_SITE_OTHER): Payer: Commercial Managed Care - PPO

## 2021-05-23 ENCOUNTER — Other Ambulatory Visit: Payer: Self-pay

## 2021-05-23 VITALS — BP 126/76 | HR 118 | Wt 160.6 lb

## 2021-05-23 DIAGNOSIS — O480 Post-term pregnancy: Secondary | ICD-10-CM

## 2021-05-23 DIAGNOSIS — Z3403 Encounter for supervision of normal first pregnancy, third trimester: Secondary | ICD-10-CM

## 2021-05-23 DIAGNOSIS — Z3A40 40 weeks gestation of pregnancy: Secondary | ICD-10-CM

## 2021-05-23 LAB — POCT URINALYSIS DIPSTICK OB
Bilirubin, UA: NEGATIVE
Blood, UA: NEGATIVE
Glucose, UA: NEGATIVE
Ketones, UA: NEGATIVE
Leukocytes, UA: NEGATIVE
Nitrite, UA: NEGATIVE
POC,PROTEIN,UA: NEGATIVE
Spec Grav, UA: 1.015 (ref 1.010–1.025)
Urobilinogen, UA: 0.2 E.U./dL
pH, UA: 7 (ref 5.0–8.0)

## 2021-05-23 NOTE — Progress Notes (Signed)
ROB: Induction "rescheduled ".  Patient still plans to deliver at [facility] with spontaneous labor.  COVID testing discussed.  BPP today.

## 2021-05-27 ENCOUNTER — Other Ambulatory Visit
Admission: RE | Admit: 2021-05-27 | Discharge: 2021-05-27 | Disposition: A | Discharge status: Home / Self Care | Payer: Medicaid Other | Source: Ambulatory Visit | Attending: Obstetrics and Gynecology | Admitting: Obstetrics and Gynecology

## 2021-05-27 ENCOUNTER — Other Ambulatory Visit: Payer: Self-pay

## 2021-05-27 DIAGNOSIS — Z01812 Encounter for preprocedural laboratory examination: Secondary | ICD-10-CM | POA: Insufficient documentation

## 2021-05-27 DIAGNOSIS — Z20822 Contact with and (suspected) exposure to covid-19: Secondary | ICD-10-CM | POA: Insufficient documentation

## 2021-05-28 ENCOUNTER — Inpatient Hospital Stay: Payer: Medicaid Other | Admitting: Anesthesiology

## 2021-05-28 ENCOUNTER — Encounter: Payer: Self-pay | Admitting: Obstetrics and Gynecology

## 2021-05-28 ENCOUNTER — Other Ambulatory Visit: Payer: Self-pay

## 2021-05-28 ENCOUNTER — Inpatient Hospital Stay
Admission: EM | Admit: 2021-05-28 | Discharge: 2021-05-30 | DRG: 768 | Disposition: A | Discharge status: Home / Self Care | Payer: Medicaid Other | Attending: Obstetrics and Gynecology | Admitting: Obstetrics and Gynecology

## 2021-05-28 DIAGNOSIS — Z6791 Unspecified blood type, Rh negative: Secondary | ICD-10-CM

## 2021-05-28 DIAGNOSIS — O26899 Other specified pregnancy related conditions, unspecified trimester: Secondary | ICD-10-CM

## 2021-05-28 DIAGNOSIS — Z20822 Contact with and (suspected) exposure to covid-19: Secondary | ICD-10-CM | POA: Diagnosis present

## 2021-05-28 DIAGNOSIS — O480 Post-term pregnancy: Secondary | ICD-10-CM | POA: Diagnosis present

## 2021-05-28 DIAGNOSIS — Z3A40 40 weeks gestation of pregnancy: Secondary | ICD-10-CM

## 2021-05-28 DIAGNOSIS — O26893 Other specified pregnancy related conditions, third trimester: Secondary | ICD-10-CM | POA: Diagnosis present

## 2021-05-28 DIAGNOSIS — O360131 Maternal care for anti-D [Rh] antibodies, third trimester, fetus 1: Secondary | ICD-10-CM | POA: Diagnosis not present

## 2021-05-28 LAB — RPR: RPR Ser Ql: NONREACTIVE

## 2021-05-28 LAB — SARS CORONAVIRUS 2 (TAT 6-24 HRS): SARS Coronavirus 2: NEGATIVE

## 2021-05-28 LAB — TYPE AND SCREEN
ABO/RH(D): A NEG
Antibody Screen: POSITIVE

## 2021-05-28 LAB — CBC
HCT: 35.2 % — ABNORMAL LOW (ref 36.0–46.0)
Hemoglobin: 12.3 g/dL (ref 12.0–15.0)
MCH: 30.1 pg (ref 26.0–34.0)
MCHC: 34.9 g/dL (ref 30.0–36.0)
MCV: 86.3 fL (ref 80.0–100.0)
Platelets: 145 10*3/uL — ABNORMAL LOW (ref 150–400)
RBC: 4.08 MIL/uL (ref 3.87–5.11)
RDW: 14.5 % (ref 11.5–15.5)
WBC: 9.5 10*3/uL (ref 4.0–10.5)
nRBC: 0 % (ref 0.0–0.2)

## 2021-05-28 LAB — ABO/RH: ABO/RH(D): A NEG

## 2021-05-28 MED ORDER — PHENYLEPHRINE 40 MCG/ML (10ML) SYRINGE FOR IV PUSH (FOR BLOOD PRESSURE SUPPORT)
80.0000 ug | PREFILLED_SYRINGE | INTRAVENOUS | Status: DC | PRN
Start: 2021-05-28 — End: 2021-05-28

## 2021-05-28 MED ORDER — ONDANSETRON HCL 4 MG/2ML IJ SOLN
4.0000 mg | Freq: Four times a day (QID) | INTRAMUSCULAR | Status: DC | PRN
  Administered 2021-05-28: 4 mg via INTRAVENOUS
  Filled 2021-05-28: qty 2

## 2021-05-28 MED ORDER — PHENYLEPHRINE 40 MCG/ML (10ML) SYRINGE FOR IV PUSH (FOR BLOOD PRESSURE SUPPORT): 80.0000 ug | PREFILLED_SYRINGE | INTRAVENOUS | Status: DC | PRN

## 2021-05-28 MED ORDER — SODIUM CHLORIDE 0.9 % IV SOLN: 1.0000 g | INTRAVENOUS | Status: DC

## 2021-05-28 MED ORDER — BUTORPHANOL TARTRATE 1 MG/ML IJ SOLN
1.0000 mg | INTRAMUSCULAR | Status: DC | PRN
  Administered 2021-05-28 (×2): 1 mg via INTRAVENOUS
  Filled 2021-05-28 (×2): qty 1

## 2021-05-28 MED ORDER — MISOPROSTOL 50MCG HALF TABLET
50.0000 ug | ORAL_TABLET | ORAL | Status: DC | PRN
  Administered 2021-05-28 (×2): 50 ug via VAGINAL
  Filled 2021-05-28 (×2): qty 1

## 2021-05-28 MED ORDER — DIBUCAINE (PERIANAL) 1 % EX OINT
1.0000 "application " | TOPICAL_OINTMENT | CUTANEOUS | Status: DC | PRN
  Administered 2021-05-29: 1 via RECTAL
  Filled 2021-05-28 (×2): qty 28

## 2021-05-28 MED ORDER — OXYTOCIN-SODIUM CHLORIDE 30-0.9 UT/500ML-% IV SOLN
INTRAVENOUS | Status: AC
  Filled 2021-05-28: qty 500

## 2021-05-28 MED ORDER — DIPHENHYDRAMINE HCL 25 MG PO CAPS: 25.0000 mg | ORAL_CAPSULE | Freq: Four times a day (QID) | ORAL | Status: DC | PRN

## 2021-05-28 MED ORDER — EPHEDRINE 5 MG/ML INJ
10.0000 mg | INTRAVENOUS | Status: DC | PRN
Start: 2021-05-28 — End: 2021-05-28

## 2021-05-28 MED ORDER — LACTATED RINGERS IV SOLN: INTRAVENOUS | Status: DC

## 2021-05-28 MED ORDER — SENNOSIDES-DOCUSATE SODIUM 8.6-50 MG PO TABS
2.0000 | ORAL_TABLET | Freq: Every day | ORAL | Status: DC
  Administered 2021-05-29 – 2021-05-30 (×2): 2 via ORAL
  Filled 2021-05-28 (×2): qty 2

## 2021-05-28 MED ORDER — ONDANSETRON HCL 4 MG PO TABS
4.0000 mg | ORAL_TABLET | ORAL | Status: DC | PRN
  Filled 2021-05-28: qty 1

## 2021-05-28 MED ORDER — TERBUTALINE SULFATE 1 MG/ML IJ SOLN: 0.2500 mg | Freq: Once | INTRAMUSCULAR | Status: DC | PRN

## 2021-05-28 MED ORDER — LIDOCAINE HCL (PF) 1 % IJ SOLN
30.0000 mL | INTRAMUSCULAR | Status: DC | PRN
  Filled 2021-05-28: qty 30

## 2021-05-28 MED ORDER — FENTANYL-BUPIVACAINE-NACL 0.5-0.125-0.9 MG/250ML-% EP SOLN
EPIDURAL | Status: AC
  Filled 2021-05-28: qty 250

## 2021-05-28 MED ORDER — DIPHENHYDRAMINE HCL 50 MG/ML IJ SOLN
12.5000 mg | INTRAMUSCULAR | Status: DC | PRN
Start: 2021-05-28 — End: 2021-05-28

## 2021-05-28 MED ORDER — MISOPROSTOL 200 MCG PO TABS
ORAL_TABLET | ORAL | Status: AC
  Filled 2021-05-28: qty 4

## 2021-05-28 MED ORDER — OXYTOCIN-SODIUM CHLORIDE 30-0.9 UT/500ML-% IV SOLN
2.5000 [IU]/h | INTRAVENOUS | Status: DC
  Administered 2021-05-28: 2.5 [IU]/h via INTRAVENOUS
  Filled 2021-05-28: qty 500

## 2021-05-28 MED ORDER — ACETAMINOPHEN 325 MG PO TABS
650.0000 mg | ORAL_TABLET | ORAL | Status: DC | PRN
  Administered 2021-05-29: 650 mg via ORAL
  Filled 2021-05-28: qty 2

## 2021-05-28 MED ORDER — SODIUM CHLORIDE 0.9 % IV SOLN: 2.0000 g | Freq: Once | INTRAVENOUS | Status: DC

## 2021-05-28 MED ORDER — FENTANYL-BUPIVACAINE-NACL 0.5-0.125-0.9 MG/250ML-% EP SOLN
12.0000 mL/h | EPIDURAL | Status: DC | PRN
  Administered 2021-05-28: 12 mL/h via EPIDURAL

## 2021-05-28 MED ORDER — COCONUT OIL OIL
1.0000 "application " | TOPICAL_OIL | Status: DC | PRN
  Administered 2021-05-30: 1 via TOPICAL
  Filled 2021-05-28 (×2): qty 120

## 2021-05-28 MED ORDER — ACETAMINOPHEN 325 MG PO TABS: 650.0000 mg | ORAL_TABLET | ORAL | Status: DC | PRN

## 2021-05-28 MED ORDER — OXYTOCIN-SODIUM CHLORIDE 30-0.9 UT/500ML-% IV SOLN
1.0000 m[IU]/min | INTRAVENOUS | Status: DC
Start: 2021-05-28 — End: 2021-05-28

## 2021-05-28 MED ORDER — SODIUM CHLORIDE 0.9 % IV SOLN
INTRAVENOUS | Status: DC | PRN
  Administered 2021-05-28 (×3): 5 mL via EPIDURAL

## 2021-05-28 MED ORDER — PRENATAL MULTIVITAMIN CH
1.0000 | ORAL_TABLET | Freq: Every day | ORAL | Status: DC
  Administered 2021-05-29 – 2021-05-30 (×2): 1 via ORAL
  Filled 2021-05-28 (×2): qty 1

## 2021-05-28 MED ORDER — ZOLPIDEM TARTRATE 5 MG PO TABS: 5.0000 mg | ORAL_TABLET | Freq: Every evening | ORAL | Status: DC | PRN

## 2021-05-28 MED ORDER — IBUPROFEN 600 MG PO TABS
600.0000 mg | ORAL_TABLET | Freq: Four times a day (QID) | ORAL | Status: DC
  Administered 2021-05-28 – 2021-05-30 (×7): 600 mg via ORAL
  Filled 2021-05-28 (×7): qty 1

## 2021-05-28 MED ORDER — BENZOCAINE-MENTHOL 20-0.5 % EX AERO
1.0000 "application " | INHALATION_SPRAY | CUTANEOUS | Status: DC | PRN
  Administered 2021-05-29: 1 via TOPICAL
  Filled 2021-05-28 (×2): qty 56

## 2021-05-28 MED ORDER — LACTATED RINGERS IV SOLN
500.0000 mL | INTRAVENOUS | Status: DC | PRN
  Administered 2021-05-28: 500 mL via INTRAVENOUS

## 2021-05-28 MED ORDER — LIDOCAINE-EPINEPHRINE (PF) 1.5 %-1:200000 IJ SOLN
INTRAMUSCULAR | Status: DC | PRN
  Administered 2021-05-28: 3 mL

## 2021-05-28 MED ORDER — EPHEDRINE 5 MG/ML INJ: 10.0000 mg | INTRAVENOUS | Status: DC | PRN

## 2021-05-28 MED ORDER — OXYTOCIN BOLUS FROM INFUSION
333.0000 mL | Freq: Once | INTRAVENOUS | Status: AC
  Administered 2021-05-28: 333 mL via INTRAVENOUS

## 2021-05-28 MED ORDER — LACTATED RINGERS IV SOLN: 500.0000 mL | Freq: Once | INTRAVENOUS | Status: AC

## 2021-05-28 MED ORDER — SIMETHICONE 80 MG PO CHEW: 80.0000 mg | CHEWABLE_TABLET | ORAL | Status: DC | PRN

## 2021-05-28 MED ORDER — ONDANSETRON HCL 4 MG/2ML IJ SOLN
4.0000 mg | INTRAMUSCULAR | Status: DC | PRN
  Administered 2021-05-28: 4 mg via INTRAVENOUS
  Filled 2021-05-28: qty 2

## 2021-05-28 MED ORDER — WITCH HAZEL-GLYCERIN EX PADS
1.0000 "application " | MEDICATED_PAD | CUTANEOUS | Status: DC | PRN
  Administered 2021-05-29: 1 via TOPICAL
  Filled 2021-05-28: qty 100

## 2021-05-28 MED ORDER — SOD CITRATE-CITRIC ACID 500-334 MG/5ML PO SOLN: 30.0000 mL | ORAL | Status: DC | PRN

## 2021-05-28 NOTE — H&P (Addendum)
Obstetric History and Physical  Cynthia Schmitt is a 20 y.o. G1P0 with IUP at [redacted]w[redacted]d presenting for scheduled IOL for postdates pregnancy. Patient states she has been having irregular contractions, denies vaginal bleeding, has intact membranes, with active fetal movement.    Prenatal Course Source of Care: [facility] with onset of care at 10 weeks Pregnancy complications or risks: Patient Active Problem List   Diagnosis Date Noted   Post-dates pregnancy 05/28/2021   Rh negative state in antepartum period 05/28/2021   She plans to breastfeed She desires  unsure method  for postpartum contraception.   Prenatal labs and studies: ABO, Rh: --/--/A NEG (10/11 0150) Antibody: POS (10/11 0150) Rubella: 2.02 (03/11 1143) RPR: Non Reactive (07/26 1140)  HBsAg: Negative (03/11 1143)  HIV: Non Reactive (03/11 1143)  JQB:HALPFXTK/-- (09/07 1030) 1 hr Glucola  normal (92) Genetic screening normal Anatomy US normal   History reviewed. No pertinent past medical history.  Past Surgical History:  Procedure Laterality Date   DENTAL SURGERY      OB History  Gravida Para Term Preterm AB Living  1            SAB IAB Ectopic Multiple Live Births               # Outcome Date GA Lbr Len/2nd Weight Sex Delivery Anes PTL Lv  1 Current             Social History   Socioeconomic History   Marital status: Married    Spouse name: [name]   Number of children: Not on file   Years of education: Not on file   Highest education level: Not on file  Occupational History   Not on file  Tobacco Use   Smoking status: Never   Smokeless tobacco: Never  Vaping Use   Vaping Use: Never used  Substance and Sexual Activity   Alcohol use: Not Currently   Drug use: Not Currently   Sexual activity: Yes    Partners: Male  Other Topics Concern   Not on file  Social History Narrative   Not on file   Social Determinants of Health   Financial Resource Strain: Not on file  Food Insecurity: Not  on file  Transportation Needs: Not on file  Physical Activity: Not on file  Stress: Not on file  Social Connections: Not on file    Family History  Problem Relation Age of Onset   Ovarian cancer Mother    Bipolar disorder Mother    Thyroid disease Mother    Healthy Father    Skin cancer Maternal Grandmother     Medications Prior to Admission  Medication Sig Dispense Refill Last Dose   Ferrous Sulfate (IRON) 325 (65 Fe) MG TABS Take by mouth.      POTASSIUM PO Take by mouth.      Prenatal Vit-Fe Fumarate-FA (MULTIVITAMIN-PRENATAL) 27-0.8 MG TABS tablet Take 1 tablet by mouth daily at 12 noon.       No Known Allergies  Review of Systems: Negative except for what is mentioned in HPI.  Physical Exam: BP 127/82 (BP Location: Right Arm)   Pulse 77   Temp 98.2 F (36.8 C) (Oral)   Resp 17   Ht 5\' 6"  (1.676 m)   Wt 72.6 kg   LMP 08/15/2020 (Approximate)   BMI 25.82 kg/m  CONSTITUTIONAL: Well-developed, well-nourished female in no acute distress.  HENT:  Normocephalic, atraumatic, External right and left ear normal. Oropharynx is clear and  moist EYES: Conjunctivae and EOM are normal. Pupils are equal, round, and reactive to light. No scleral icterus.  NECK: Normal range of motion, supple, no masses SKIN: Skin is warm and dry. No rash noted. Not diaphoretic. No erythema. No pallor. NEUROLOGIC: Alert and oriented to person, place, and time. Normal reflexes, muscle tone coordination. No cranial nerve deficit noted. PSYCHIATRIC: Normal mood and affect. Normal behavior. Normal judgment and thought content. CARDIOVASCULAR: Normal heart rate noted, regular rhythm RESPIRATORY: Effort and breath sounds normal, no problems with respiration noted ABDOMEN: Soft, nontender, nondistended, gravid. MUSCULOSKELETAL: Normal range of motion. No edema and no tenderness. 2+ distal pulses.  Cervical Exam: Dilatation 3cm   Effacement 70%   Station -2   Presentation: cephalic FHT:  Baseline rate  469 bpm   Variability moderate  Accelerations present   Decelerations none Contractions: Every 1-4 mins   Pertinent Labs/Studies:   Results for orders placed or performed during the hospital encounter of 05/28/21 (from the past 24 hour(s))  CBC     Status: Abnormal   Collection Time: 05/28/21 12:45 AM  Result Value Ref Range   WBC 9.5 4.0 - 10.5 K/uL   RBC 4.08 3.87 - 5.11 MIL/uL   Hemoglobin 12.3 12.0 - 15.0 g/dL   HCT 62.9 (L) 52.8 - 41.3 %   MCV 86.3 80.0 - 100.0 fL   MCH 30.1 26.0 - 34.0 pg   MCHC 34.9 30.0 - 36.0 g/dL   RDW 24.4 01.0 - 27.2 %   Platelets 145 (L) 150 - 400 K/uL   nRBC 0.0 0.0 - 0.2 %  Type and screen     Status: None (Preliminary result)   Collection Time: 05/28/21 12:45 AM  Result Value Ref Range   ABO/RH(D) PENDING    Antibody Screen PENDING    Sample Expiration      05/31/2021,2359 Performed at [facility], [address] [place], [place] [address]   ABO/Rh     Status: None   Collection Time: 05/28/21 12:45 AM  Result Value Ref Range   ABO/RH(D)      A NEG Performed at [facility], [address] [place], [place] [address]   Type and screen     Status: None   Collection Time: 05/28/21  1:50 AM  Result Value Ref Range   ABO/RH(D) A NEG    Antibody Screen POS    Sample Expiration 05/31/2021,2359    Antibody Identification      PASSIVELY ACQUIRED ANTI-D Performed at [facility], [address] [place], [place] [address]     Assessment : Cynthia Schmitt is a 20 y.o. G1P0 at [redacted]w[redacted]d being admitted for induction of labor due to postdates pregnancy.  Rh negative.   Plan: Labor: Induction as ordered as per protocol with Cytotec. Received second dose at 0457. Analgesia as needed. FWB: Reassuring fetal heart tracing.  GBS negative Delivery plan: Hopeful for vaginal delivery    Hildred Laser, MD [facility]

## 2021-05-28 NOTE — Anesthesia Preprocedure Evaluation (Signed)
Anesthesia Evaluation  Patient identified by MRN, date of birth, ID band Patient awake    Reviewed: Allergy & Precautions, NPO status , Patient's Chart, lab work & pertinent test results  History of Anesthesia Complications Negative for: history of anesthetic complications  Airway Mallampati: II  TM Distance: >3 FB Neck ROM: Full    Dental no notable dental hx.    Pulmonary neg pulmonary ROS, neg sleep apnea, neg COPD,    breath sounds clear to auscultation- rhonchi (-) wheezing      Cardiovascular Exercise Tolerance: Good (-) hypertension(-) CAD and (-) Past MI  Rhythm:Regular Rate:Normal - Systolic murmurs and - Diastolic murmurs    Neuro/Psych negative neurological ROS  negative psych ROS   GI/Hepatic negative GI ROS, Neg liver ROS,   Endo/Other  negative endocrine ROSneg diabetes  Renal/GU negative Renal ROS     Musculoskeletal negative musculoskeletal ROS (+)   Abdominal Gravid abdomen  Peds  Hematology negative hematology ROS (+)   Anesthesia Other Findings   Reproductive/Obstetrics (+) Pregnancy                             Lab Results  Component Value Date   WBC 9.5 05/28/2021   HGB 12.3 05/28/2021   HCT 35.2 (L) 05/28/2021   MCV 86.3 05/28/2021   PLT 145 (L) 05/28/2021    Anesthesia Physical Anesthesia Plan  ASA: 2  Anesthesia Plan: Epidural   Post-op Pain Management:    Induction:   PONV Risk Score and Plan: 2  Airway Management Planned:   Additional Equipment:   Intra-op Plan:   Post-operative Plan:   Informed Consent: I have reviewed the patients History and Physical, chart, labs and discussed the procedure including the risks, benefits and alternatives for the proposed anesthesia with the patient or authorized representative who has indicated his/her understanding and acceptance.       Plan Discussed with: CRNA and Anesthesiologist  Anesthesia  Plan Comments: (Plan for epidural for labor, discussed epidural vs spinal vs GA if need for csection)        Anesthesia Quick Evaluation

## 2021-05-28 NOTE — Anesthesia Procedure Notes (Signed)
Epidural Patient location during procedure: OB Start time: 05/28/2021 11:19 AM End time: 05/28/2021 11:39 AM  Staffing Anesthesiologist: Alver Fisher, MD Performed: anesthesiologist   Preanesthetic Checklist Completed: patient identified, IV checked, site marked, risks and benefits discussed, surgical consent, monitors and equipment checked, pre-op evaluation and timeout performed  Epidural Patient position: sitting Prep: ChloraPrep Patient monitoring: heart rate, continuous pulse ox and blood pressure Approach: midline Location: L4-L5 Injection technique: LOR saline  Needle:  Needle type: Tuohy  Needle gauge: 18 G Needle length: 9 cm and 9 Needle insertion depth: 4.5 cm Catheter type: closed end flexible Catheter size: 20 Guage Catheter at skin depth: 9 cm Test dose: negative (0.125% bupivacaine)  Assessment Events: blood not aspirated, injection not painful, no injection resistance, no paresthesia and negative IV test  Additional Notes   Patient tolerated the insertion well without complications.Reason for block:procedure for pain

## 2021-05-28 NOTE — Progress Notes (Signed)
Intrapartum Progress Note  S: Patient more comfortable, is s/p epidural.   O: Blood pressure 130/72, pulse (!) 105, temperature (!) 97.2 F (36.2 C), temperature source Axillary, resp. rate 18, height 5\' 6"  (1.676 m), weight 72.6 kg, last menstrual period 08/15/2020, SpO2 100 %.  Gen App: NAD, comfortable Abdomen: soft, gravid FHT: baseline 120 bpm.  Accels present.  Decels absent. moderate in degree variability.   Tocometer: contractions q 1-2 minutes Cervix: 7.5-8/100/0 Extremities: Nontender, no edema.  Pitocin: None  Labs:  No new labs  Assessment:  1: SIUP at [redacted]w[redacted]d 2. Rh negative  Plan:  1. Expectant management  2. Anticipate delivery soon.    [redacted]w[redacted]d, MD 05/28/2021 12:45 PM

## 2021-05-29 LAB — FETAL SCREEN: Fetal Screen: NEGATIVE

## 2021-05-29 LAB — CBC
HCT: 32.4 % — ABNORMAL LOW (ref 36.0–46.0)
Hemoglobin: 11.3 g/dL — ABNORMAL LOW (ref 12.0–15.0)
MCH: 31 pg (ref 26.0–34.0)
MCHC: 34.9 g/dL (ref 30.0–36.0)
MCV: 88.8 fL (ref 80.0–100.0)
Platelets: 164 10*3/uL (ref 150–400)
RBC: 3.65 MIL/uL — ABNORMAL LOW (ref 3.87–5.11)
RDW: 14.6 % (ref 11.5–15.5)
WBC: 19.2 10*3/uL — ABNORMAL HIGH (ref 4.0–10.5)
nRBC: 0 % (ref 0.0–0.2)

## 2021-05-29 MED ORDER — RHO D IMMUNE GLOBULIN 1500 UNIT/2ML IJ SOSY
300.0000 ug | PREFILLED_SYRINGE | Freq: Once | INTRAMUSCULAR | Status: AC
  Administered 2021-05-29: 300 ug via INTRAVENOUS
  Filled 2021-05-29: qty 2

## 2021-05-29 NOTE — Progress Notes (Signed)
Post Partum Day # 1, s/p SVD  Subjective: no complaints, up ad lib, voiding, and tolerating PO  Objective: Temp:  [97.2 F (36.2 C)-99.9 F (37.7 C)] 98.1 F (36.7 C) (10/12 0358) Pulse Rate:  [77-122] 83 (10/12 0358) Resp:  [14-20] 18 (10/12 0358) BP: (105-143)/(60-92) 108/68 (10/12 0358) SpO2:  [96 %-100 %] 99 % (10/12 0358)  Physical Exam:  General: alert, cooperative, and no distress  Lungs: clear to auscultation bilaterally Breasts: normal appearance, no masses or tenderness Heart: regular rate and rhythm, S1, S2 normal, no murmur, click, rub or gallop Abdomen: soft, non-tender; bowel sounds normal; no masses,  no organomegaly Pelvis: Lochia: appropriate, Uterine Fundus: firm Extremities: DVT Evaluation: No evidence of DVT seen on physical exam. Negative Homan's sign. No cords or calf tenderness. No significant calf/ankle edema.  Recent Labs    05/28/21 0045 05/29/21 0420  HGB 12.3 11.3*  HCT 35.2* 32.4*    Assessment/Plan: Doing well postpartum Breastfeeding, Lactation consult Circumcision prior to discharge Contraception OCPs Infant in special Care nursery currently.  Plan for discharge tomorrow.   LOS: 1 day   Hildred Laser, MD [facility] 05/29/2021 6:55 AM

## 2021-05-29 NOTE — Lactation Note (Signed)
This note was copied from a baby's chart. Lactation Consultation Note  Patient Name: Cynthia Schmitt [id number] Date: 05/29/2021 Reason for consult: Initial assessment;Primapara;Term;NICU baby Age:21 hours  Initial lactation visit. Mom and dad are at bedside with baby [name] in [facility]. Mom is P1, SVD 17hrs ago. Baby admitted SCN due to respiratory distress.  Mom just completed the first feeding with Bellamy at the breast for over 40 minutes. Mom noted no pain or discomfort and the ability to reposition him if needed. Mom also states that she began hand expressing prior to delivery and has colostrum frozen at home in 2-97mL increments. LC encouraged to utilize that colostrum sooner rather than later.  Mom was set-up with a pump overnight, and she has used it 2x. LC also encouraged continued use as needed throughout the day: if [name] is tired, uninterested in feeding, or post feedings for additional stimulation.  SCN to call parents at each feeding for mom to be present. Parents encouraged to call lactation with questions/concerns and for ongoing BF support.  Maternal Data Has patient been taught Hand Expression?: Yes Does the patient have breastfeeding experience prior to this delivery?: No  Feeding Mother's Current Feeding Choice: Breast Milk  LATCH Score                    Lactation Tools Discussed/Used Tools: Pump Breast pump type: Double-Electric Breast Pump Reason for Pumping: SCN Pumping frequency: q 3hrs  Interventions Interventions: Breast feeding basics reviewed;Education;DEBP  Discharge Pump: Personal  Consult Status Consult Status: Follow-up Date: 05/29/21 Follow-up type: In-patient    Danford Bad 05/29/2021, 10:49 AM

## 2021-05-30 LAB — RHOGAM INJECTION: Unit division: 0

## 2021-05-30 MED ORDER — IBUPROFEN 600 MG PO TABS: 600.0000 mg | ORAL_TABLET | Freq: Four times a day (QID) | ORAL | 0 refills | Status: DC | PRN

## 2021-05-30 NOTE — Progress Notes (Signed)
Patient discharged home with infant. Rx sent to pharmacy. PP instructions given and reviewed with patient. Patient verbalized understanding. Patient escorted out by auxiliary.   Peter Minium 05/30/2021 12:15 PM

## 2021-05-30 NOTE — Progress Notes (Signed)
Post Partum Day # 2, s/p SVD  Subjective: no complaints, up ad lib, voiding, and tolerating PO  Objective: Vitals:   05/29/21 1300 05/29/21 1707 05/29/21 2319 05/30/21 0700  BP:  107/73 105/60 94/60  Pulse: 88 (!) 105 90 70  Resp:  20 18 18   Temp:  98.1 F (36.7 C) 97.9 F (36.6 C) 98 F (36.7 C)  TempSrc:  Oral Oral Oral  SpO2:  100% 97% 98%  Weight:      Height:        Physical Exam:  General: alert, cooperative, and no distress  Lungs: clear to auscultation bilaterally Breasts: normal appearance, no masses or tenderness Heart: regular rate and rhythm, S1, S2 normal, no murmur, click, rub or gallop Abdomen: soft, non-tender; bowel sounds normal; no masses,  no organomegaly Pelvis: Lochia: appropriate, Uterine Fundus: firm Extremities: DVT Evaluation: No evidence of DVT seen on physical exam. Negative Homan's sign. No cords or calf tenderness. No significant calf/ankle edema.   Recent Labs    05/28/21 0045 05/29/21 0420  HGB 12.3 11.3*  HCT 35.2* 32.4*     Assessment/Plan: Doing well postpartum Breastfeeding, Lactation s/p consult Circumcision prior to discharge Contraception OCPs Infant out now out of SCN. Rhogam given for Rh negative status.  Plan for discharge today.    LOS: 2 days   07/29/21, MD [facility] 05/30/2021 9:03 AM

## 2021-05-30 NOTE — Lactation Note (Signed)
This note was copied from a baby's chart. Lactation Consultation Note  Patient Name: Cynthia Schmitt [id number] Date: 05/30/2021 Reason for consult: Follow-up assessment;Primapara;Term Age:21 hours  Lactation follow-up prior to discharge. Baby and mom are doing well, baby active at breast when LC entered. Mom notes some soreness with initial latch, using coconut oil post feedings for comfort.  Reviewed with parents breastfeeding expectations in the days to come: feeding frequency, cluster feeding, and output. Tips given for keeping baby awake at the breast, signs of adequate transfer. Education on milk supply and demand and normal course of lactation.  Guidance given for anticipated breast changes, breast fullness and engorgement, management of both and nipple care.  Discussed delaying artificial nipples: pacifier and bottles until 3-4 weeks, impact this can have on breastfeeding. Information given for outpatient lactation services and community breastfeeding support.  Maternal Data Has patient been taught Hand Expression?: Yes Does the patient have breastfeeding experience prior to this delivery?: No  Feeding Mother's Current Feeding Choice: Breast Milk  LATCH Score                    Lactation Tools Discussed/Used    Interventions Interventions: Breast feeding basics reviewed;Education;Pre-pump if needed;Reverse pressure  Discharge Discharge Education: Engorgement and breast care;Warning signs for feeding baby;Outpatient recommendation Pump: Personal  Consult Status Consult Status: Complete Date: 05/30/21 Follow-up type: Call as needed    Danford Bad 05/30/2021, 10:55 AM

## 2021-05-30 NOTE — Discharge Summary (Signed)
Postpartum Discharge Summary     Patient Name: Cynthia Schmitt DOB: [birth year] MRN: [id number]  Date of admission: 05/28/2021 Delivery date:05/28/2021  Delivering provider: Rubie Maid  Date of discharge: 05/30/2021  Admitting diagnosis: Post-dates pregnancy [O48.0] Intrauterine pregnancy: 108w6d    Secondary diagnosis:  Active Problems:   Post-dates pregnancy   Rh negative state in antepartum period  Additional problems: None    Discharge diagnosis: Term Pregnancy Delivered                                              Post partum procedures:rhogam Augmentation: Cytotec Complications: None  Hospital course: Induction of Labor With Vaginal Delivery   20y.o. yo G1P1001 at 426w6das admitted to the hospital 05/28/2021 for induction of labor.  Indication for induction: Postdates.  Patient had an uncomplicated labor course as follows: Membrane Rupture Time/Date: 11:09 AM ,05/28/2021   Delivery Method:Vaginal, Spontaneous  Episiotomy: None  Lacerations:  3rd degree;Perineal  Details of delivery can be found in separate delivery note.  Patient had a routine postpartum course. Patient is discharged home 05/30/21.  Newborn Data: Birth date:05/28/2021  Birth time:5:02 PM  Gender:Female  Living status:Living  Apgars:6 ,6  Weight:3800 g   Magnesium Sulfate received: No BMZ received: No Rhophylac:Yes MMR:No. Up to date T-DaP:Given prenatally Flu: Yes, given prenatally Transfusion:No  Physical exam  Vitals:   05/29/21 1300 05/29/21 1707 05/29/21 2319 05/30/21 0700  BP:  107/73 105/60 94/60  Pulse: 88 (!) 105 90 70  Resp:  _0 Temp:  98.1 F (36.7 C) 97.9 F (36.6 C) 98 F (36.7 C)  TempSrc:  Oral Oral Oral  SpO2:  100% 97% 98%  Weight:      Height:       General: alert, cooperative, and no distress Lochia: appropriate Uterine Fundus: firm Incision: N/A DVT Evaluation: No evidence of DVT seen on physical exam. Negative Homan's sign. No cords or calf  tenderness. No significant calf/ankle edema. Labs: Lab Results  Component Value Date   WBC 19.2 (H) 05/29/2021   HGB 11.3 (L) 05/29/2021   HCT 32.4 (L) 05/29/2021   MCV 88.8 05/29/2021   PLT 164 05/29/2021   No flowsheet data found. Edinburgh Score: Edinburgh Postnatal Depression Scale Screening Tool 05/29/2021  I have been able to laugh and see the funny side of things. 0  I have looked forward with enjoyment to things. 0  I have blamed myself unnecessarily when things went wrong. 0  I have been anxious or worried for no good reason. 1  I have felt scared or panicky for no good reason. 0  Things have been getting on top of me. 0  I have been so unhappy that I have had difficulty sleeping. 0  I have felt sad or miserable. 0  I have been so unhappy that I have been crying. 0  The thought of harming myself has occurred to me. 0  Edinburgh Postnatal Depression Scale Total 1      After visit meds:  Allergies as of 05/30/2021   No Known Allergies      Medication List     TAKE these medications    ibuprofen 600 MG tablet Commonly known as: ADVIL Take 1 tablet (600 mg total) by mouth every 6 (six) hours as needed for mild pain or cramping.   Iron  325 (65 Fe) MG Tabs Take by mouth.   multivitamin-prenatal 27-0.8 MG Tabs tablet Take 1 tablet by mouth daily at 12 noon.   POTASSIUM PO Take by mouth.         Discharge home in stable condition Infant Feeding: Breast Infant Disposition:home with mother Discharge instruction: per After Visit Summary and Postpartum booklet. Activity: Advance as tolerated. Pelvic rest for 6 weeks.  Diet: routine diet Anticipated Birth Control: POPs Postpartum Appointment:6 weeks Additional Postpartum F/U: Postpartum Depression checkup in 2 weeks Future Appointments:No future appointments. Follow up Visit:  Follow-up Information     Rubie Maid, MD Follow up.   Specialties: Obstetrics and Gynecology, Radiology Why: 2 weeks  televisit postpartum mood check 6 weeks postpartum visit in office Contact information: [address] [place] [address] [phone]                     05/30/2021 Rubie Maid, MD

## 2021-05-30 NOTE — Anesthesia Postprocedure Evaluation (Signed)
Anesthesia Post Note  Patient: Cynthia Schmitt  Procedure(s) Performed: AN AD HOC LABOR EPIDURAL  Patient location during evaluation: Mother Baby Anesthesia Type: Epidural Level of consciousness: oriented and awake and alert Pain management: pain level controlled Vital Signs Assessment: post-procedure vital signs reviewed and stable Respiratory status: spontaneous breathing and respiratory function stable Cardiovascular status: blood pressure returned to baseline and stable Postop Assessment: no headache, no backache, no apparent nausea or vomiting and able to ambulate Anesthetic complications: no   No notable events documented.   Last Vitals:  Vitals:   05/29/21 2319 05/30/21 0700  BP: 105/60 94/60  Pulse: 90 70  Resp: 18 18  Temp: 36.6 C 36.7 C  SpO2: 97% 98%    Last Pain:  Vitals:   05/30/21 0800  TempSrc:   PainSc: 0-No pain                 Starling Manns

## 2021-06-11 ENCOUNTER — Encounter: Payer: Self-pay | Admitting: Obstetrics and Gynecology

## 2021-06-11 ENCOUNTER — Telehealth (INDEPENDENT_AMBULATORY_CARE_PROVIDER_SITE_OTHER): Payer: Commercial Managed Care - PPO | Admitting: Obstetrics and Gynecology

## 2021-06-11 VITALS — Ht 66.0 in

## 2021-06-11 DIAGNOSIS — Z1332 Encounter for screening for maternal depression: Secondary | ICD-10-CM | POA: Diagnosis not present

## 2021-06-11 NOTE — Patient Instructions (Signed)

## 2021-06-11 NOTE — Progress Notes (Signed)
  Virtual Visit via Telephone Note  I connected with Cynthia Schmitt on 06/11/21 at  1:30 PM EDT by telephone and verified that I am speaking with the correct person using two identifiers.  Location: Patient: Home Provider: Office   I discussed the limitations, risks, security and privacy concerns of performing an evaluation and management service by telephone and the availability of in person appointments. I also discussed with the patient that there may be a patient responsible charge related to this service. The patient expressed understanding and agreed to proceed.   History of Present Illness: Cynthia Schmitt is a 20 y.o. G75P1001 female who presents for a postpartum visit with mood check. She is 2 weeks postpartum following a spontaneous vaginal delivery. I have fully reviewed the prenatal and intrapartum course. The delivery was at [redacted]w[redacted]d gestational weeks (IOL for postdates pregnancy).  Anesthesia: epidural. Postpartum course has been well. Baby's course has been well. Baby is feeding by breast. Bleeding: patient has not not resumed menses. Bowel function is normal. Bladder function is normal. Patient is not sexually active. Contraception method desired is OCP - oral progesterone-only contraceptive. Postpartum depression screening: negative.  EDPS score is 0.   Observations/Objective:  Height 5\' 6"  (1.676 m), currently breastfeeding. Patient has not weighed herself since hospitalization.  Gen App: NAD Psych: Mood and affect appear normal. Speech is normal.    Edinburgh Postnatal Depression Scale Screening Tool 06/11/2021 05/29/2021  I have been able to laugh and see the funny side of things. 0 0  I have looked forward with enjoyment to things. 0 0  I have blamed myself unnecessarily when things went wrong. 0 0  I have been anxious or worried for no good reason. 0 1  I have felt scared or panicky for no good reason. 0 0  Things have been getting on top of me. 0 0  I have been so unhappy  that I have had difficulty sleeping. 0 0  I have felt sad or miserable. 0 0  I have been so unhappy that I have been crying. 0 0  The thought of harming myself has occurred to me. 0 0  Edinburgh Postnatal Depression Scale Total 0 1      Assessment and Plan: 1. Encounter for screening for maternal depression   2. Postpartum state   3. Spontaneous vaginal delivery     Follow Up Instructions:  Patient doing well. To f/u in 4 weeks for final postpartum visit. Will receive contraception at that time.    I discussed the assessment and treatment plan with the patient. The patient was provided an opportunity to ask questions and all were answered. The patient agreed with the plan and demonstrated an understanding of the instructions.   The patient was advised to call back or seek an in-person evaluation if the symptoms worsen or if the condition fails to improve as anticipated.  I provided 6 minutes of non-face-to-face time during this encounter.   07/29/2021, MD [facility]

## 2021-06-25 ENCOUNTER — Encounter: Payer: Medicaid Other | Admitting: Obstetrics and Gynecology

## 2021-07-08 NOTE — Progress Notes (Signed)
   OBSTETRICS POSTPARTUM CLINIC PROGRESS NOTE  Subjective:     Cynthia Schmitt is a 20 y.o. G45P1001 female who presents for a postpartum visit. She is 6 weeks postpartum following a spontaneous vaginal delivery. I have fully reviewed the prenatal and intrapartum course. The delivery was at 40 gestational weeks.  Anesthesia: epidural. Postpartum course has been uncomplicated. Baby's course has been uncomplicated. Baby is feeding by both breast and bottle - Cynthia Schmitt . Bleeding: patient has not resumed menses, with No LMP recorded. Bowel function is normal. Bladder function is abnormal: Has had trouble urinating, dribbling.  Difficulties emptying her bladder . Patient is not sexually active. Contraception method desired is oral progesterone-only contraceptive. Postpartum depression screening:   EDPS score is 0.    The following portions of the patient's history were reviewed and updated as appropriate: allergies, current medications, past family history, past medical history, past social history, past surgical history, and problem list.  Review of Systems Pertinent items noted in HPI and remainder of comprehensive ROS otherwise negative.   Objective:    BP 105/77   Pulse (!) 104   Ht 5\' 6"  (1.676 m)   Wt 133 lb 12.8 oz (60.7 kg)   Breastfeeding Yes   BMI 21.60 kg/m   General:  alert and no distress   Breasts:  inspection negative, no nipple discharge or bleeding, no masses or nodularity palpable  Lungs: clear to auscultation bilaterally  Heart:  regular rate and rhythm, S1, S2 normal, no murmur, click, rub or gallop  Abdomen: soft, non-tender; bowel sounds normal; no masses,  no organomegaly.     Vulva:  normal  Vagina: normal vagina, no discharge, exudate, lesion, or erythema  Cervix:  no cervical motion tenderness and no lesions  Corpus: normal size, contour, position, consistency, mobility, non-tender  Adnexa:  normal adnexa and no mass, fullness, tenderness  Rectal Exam: Not  performed.         Labs:  Lab Results  Component Value Date   HGB 11.3 (L) 05/29/2021    Assessment:   1. Postpartum care following vaginal delivery   2. Cervical cancer screening   3. Encounter for initial prescription of contraceptives, unspecified contraceptive   4. Abnormality of pelvis affecting postpartum period      Plan:   1. Contraception: oral progesterone-only contraceptive. UPT negative today.  2. Pap smear performed today.  3. Will refer to pelvic floor physical therapy for postpartum bladder issues (likely some dysfunction with pelvic floor).  4. Follow up in: 4-6 months  for annual exam, or sooner as needed.   07/29/2021, MD [facility]

## 2021-07-09 ENCOUNTER — Other Ambulatory Visit (HOSPITAL_COMMUNITY)
Admission: RE | Admit: 2021-07-09 | Discharge: 2021-07-09 | Disposition: A | Discharge status: Home / Self Care | Payer: Commercial Managed Care - PPO | Source: Ambulatory Visit | Attending: Obstetrics and Gynecology | Admitting: Obstetrics and Gynecology

## 2021-07-09 ENCOUNTER — Ambulatory Visit (INDEPENDENT_AMBULATORY_CARE_PROVIDER_SITE_OTHER): Payer: Commercial Managed Care - PPO | Admitting: Obstetrics and Gynecology

## 2021-07-09 ENCOUNTER — Other Ambulatory Visit: Payer: Self-pay

## 2021-07-09 ENCOUNTER — Encounter: Payer: Self-pay | Admitting: Obstetrics and Gynecology

## 2021-07-09 DIAGNOSIS — R3911 Hesitancy of micturition: Secondary | ICD-10-CM

## 2021-07-09 DIAGNOSIS — O330 Maternal care for disproportion due to deformity of maternal pelvic bones: Secondary | ICD-10-CM | POA: Diagnosis not present

## 2021-07-09 DIAGNOSIS — Z124 Encounter for screening for malignant neoplasm of cervix: Secondary | ICD-10-CM | POA: Diagnosis not present

## 2021-07-09 DIAGNOSIS — Z30019 Encounter for initial prescription of contraceptives, unspecified: Secondary | ICD-10-CM

## 2021-07-09 DIAGNOSIS — N3943 Post-void dribbling: Secondary | ICD-10-CM

## 2021-07-09 LAB — POCT URINE PREGNANCY: Preg Test, Ur: NEGATIVE

## 2021-07-09 MED ORDER — SLYND 4 MG PO TABS: 1.0000 | ORAL_TABLET | Freq: Every day | ORAL | 3 refills | Status: DC

## 2021-07-09 NOTE — Patient Instructions (Signed)

## 2021-07-16 LAB — CYTOLOGY - PAP: Diagnosis: NEGATIVE

## 2021-07-17 ENCOUNTER — Encounter: Payer: Self-pay | Admitting: Physical Therapy

## 2021-07-17 ENCOUNTER — Other Ambulatory Visit: Payer: Self-pay

## 2021-07-17 ENCOUNTER — Ambulatory Visit: Payer: Commercial Managed Care - PPO | Attending: Obstetrics and Gynecology | Admitting: Physical Therapy

## 2021-07-17 DIAGNOSIS — O330 Maternal care for disproportion due to deformity of maternal pelvic bones: Secondary | ICD-10-CM | POA: Insufficient documentation

## 2021-07-17 DIAGNOSIS — R278 Other lack of coordination: Secondary | ICD-10-CM | POA: Insufficient documentation

## 2021-07-17 DIAGNOSIS — M6281 Muscle weakness (generalized): Secondary | ICD-10-CM | POA: Insufficient documentation

## 2021-07-17 NOTE — Therapy (Signed)
[facility] [address] [place], [place], [address] Phone: [phone]   Fax:  [phone]  Physical Therapy Evaluation  Patient Details  Name: Cynthia Schmitt MRN: [id number] Date of Birth: 2001-04-05 Referring Provider (PT): Hildred Laser   Encounter Date: 07/17/2021   PT End of Session - 07/17/21 1752     Visit Number 1    Number of Visits 12    Date for PT Re-Evaluation 10/09/21    Authorization Type IE 07/17/2021    PT Start Time 1800    PT Stop Time 1840    PT Time Calculation (min) 40 min    Activity Tolerance Patient tolerated treatment well    Behavior During Therapy [facility] for tasks assessed/performed             History reviewed. No pertinent past medical history.  Past Surgical History:  Procedure Laterality Date   DENTAL SURGERY      There were no vitals filed for this visit.        [facility] PT Assessment - 07/17/21 0001       Assessment   Medical Diagnosis Abnormal pelvis in post-partum state    Referring Provider (PT) Valentino Saxon, Anika    Hand Dominance Left    Prior Therapy None for this dx      Balance Screen   Has the patient fallen in the past 6 months No             PELVIC HEALTH PHYSICAL THERAPY EVALUATION  SCREENING Red Flags: none Have you had any night sweats? Unexplained weight loss? Saddle anesthesia? Unexplained changes in bowel or bladder habits?   Precautions: none  SUBJECTIVE  Chief Complaint: Patient states that she is having an issue with emptying bladder. She notes that she has pain with initiation of urination. She also notes difficulty with stream of urine. Patient states that now she has to strain to urinate and completely empty. Patient endorses intermittent stream as well. Patient notes that she gets an inconsistent urge to urinate and the urge is not strong (5/10). Patient has monitored scar since delivery; notes that she pushed for 45 minutes and did a lot of  inhaling and bearing down.   Pertinent History:  Falls Negative.  Scoliosis Negative. Pulmonary disease/dysfunction Negative. Surgical history: Negative.    Obstetrical History: G1P1 Deliveries: SVD Tearing/Episiotomy: 3rd degree   Gynecological History: Hysterectomy: No  Endometriosis: Negative Pelvic Organ Prolapse: Negative Last Menstrual Period: currently breastfeeding Pain with exam: Yes ; notes this is her baseline Heaviness/pressure: No   Urinary History: Incontinence: Negative. Patient notes that she is having continued vaginal discharge since giving birth but does not believe this is urine.  Fluid Intake: ~32 oz H20, 1x 12oz dr pepper sodas, occasional apple juice, occasional tea Nocturia: 0-1x/night Frequency of urination: every 4 hours Toileting posture: heels elevated Pain with urination: Positive for initiation. Difficulty initiating urination: Positive Intermittent stream: Positive Frequent UTI: Negative.   Gastrointestinal History: [place] Stool Chart: Type 4 Frequency of BMs: 3-4x/ week Pain with defecation: Negative Straining with defecation: Positive for occasional. Hemorrhoids: Positive; external; active/latent Toileting posture: heels elevated  Sexual activity/pain: Patient has not returned to sexual activity yet. Historically: Pain with intercourse: Positive.   Initial penetration: Yes  Deep thrusting: Yes Position dependent. External stimulation: No Change in ability to achieve orgasm: No  Location of pain: vagina Current pain:  0/10  Max pain:  /10 Least pain:  /10 Pain quality: pain quality: pressure Radiating  pain: No    OBJECTIVE  Mental Status Patient is oriented to person, place and time.  Recent memory is intact.  Remote memory is intact.  Attention span and concentration are intact.  Expressive speech is intact.  Patient's fund of knowledge is within normal limits for educational level.  POSTURE/OBSERVATIONS:  Lumbar  lordosis: mildly increased Thoracic kyphosis: increased Iliac crest height: equal bilaterally Lumbar lateral shift: negative Pelvic obliquity: R ASIS slightly anterior on palpation in standing posture Patient has posture of early post-partum female; increased lordosis with increased kyphosis and rounding of shoulders associated with caregiving postures.  GAIT: Grossly WNL.   RANGE OF MOTION: deferred 2/2 to time constraints   LEFT RIGHT  Lumbar forward flexion (65):      Lumbar extension (30):     Lumbar lateral flexion (25):     Thoracic and Lumbar rotation (30 degrees):       Hip Flexion (0-125):      Hip IR (0-45):     Hip ER (0-45):     Hip Abduction (0-40):     Hip extension (0-15):        SENSATION: deferred 2/2 to time constraints  STRENGTH: MMT deferred 2/2 to time constraints  RLE LLE  Hip Flexion    Hip Extension    Hip Abduction     Hip Adduction     Hip ER     Hip IR     Knee Extension    Knee Flexion    Dorsiflexion     Plantarflexion (seated)     ABDOMINAL: deferred 2/2 to time constraints Palpation: Diastasis: Scar mobility: Rib flare:  SPECIAL TESTS: deferred 2/2 to time constraints  PHYSICAL PERFORMANCE MEASURES: STS: WNL Deep Squat: RLE STS: LLE STS:  : 5TSTS:    EXTERNAL PELVIC EXAM: deferred 2/2 to time constraints Palpation: Breath coordination: Voluntary Contraction: present/absent Relaxation: full/delayed/non-relaxing Perineal movement with sustained IAP increase ("bear down"): descent/no change/elevation/excessive descent Perineal movement with rapid IAP increase ("cough"): elevation/no change/descent  INTERNAL VAGINAL EXAM: deferred 2/2 to time constraints Introitus Appears:  Skin integrity:  Scar mobility: Strength (PERF):  Symmetry: Palpation: Prolapse:   INTERNAL RECTAL EXAM: not indicated Strength (PERF): Symmetry: Palpation: Prolapse:   OUTCOME MEASURES: FOTO (urinary problem  98)   ASSESSMENT Patient is a 20 year old presenting to clinic with chief complaints of incomplete bladder emptying. Today's evaluation suggests deficits in PFM coordination, PFM extensibility, IAP management, and bladder habits as evidenced by urinary hesitancy, intermittent stream, pressure/pain with initiation of urination, lack of urinary urge, and pain with penetration. Patient's responses on FOTO outcome measures (98) indicate minimal functional limitations/disability/distress; however the measure is limited in the assessment of incomplete emptying. Patient's progress may be limited due to presence of pelvic pain with penetration and pelvic examination prior to pregnancy; however, patient's motivation is advantageous. Patient was able to achieve basic understanding of PFM functions during today's evaluation and responded positively to educational interventions. Patient will benefit from continued skilled therapeutic intervention to address deficits in PFM coordination, PFM extensibility, IAP management, and bladder habits in order to increase function and improve overall QOL.  EDUCATION Patient educated on prognosis, POC, and provided with HEP including: bladder diary; strategies for emptying handout. Patient articulated understanding and returned demonstration. Patient will benefit from further education in order to maximize compliance and understanding for long-term therapeutic gains.  TREATMENT Neuromuscular Re-education: Patient educated on primary functions of the pelvic floor including: posture/balance, sexual pleasure, storage and elimination of waste from the  body, abdominal cavity closure, and breath coordination.     Objective measurements completed on examination: See above findings.          PT Long Term Goals - 07/17/21 1851       PT LONG TERM GOAL #1   Title Patient will demonstrate independence with HEP in order to maximize therapeutic gains and improve carryover from  physical therapy sessions to ADLs in the home and community.    Baseline IE: not demonstrated    Time 12    Period Weeks    Status New    Target Date 10/09/21      PT LONG TERM GOAL #2   Title Patient will demonstrate circumferential and sequential contraction of >4/5 MMT, > 6 sec hold x10 and 5 consecutive quick flicks with </= 10 min rest between testing bouts, and relaxation of the PFM coordinated with breath for improved management of intra-abdominal pressure and normal bowel and bladder function without the presence of pain nor incontinence in order to improve participation at home and in the community.    Baseline IE: not demonstrated    Time 12    Period Weeks    Status New    Target Date 10/09/21      PT LONG TERM GOAL #3   Title Patient will demonstrate coordinated lengthening and relaxation of PFM with diaphragmatic inhalation in order to decrease spasm and allow for unrestricted elimination of urine/feces for improved overall QOL.    Baseline IE: not demonstrated    Time 12    Period Weeks    Status New    Target Date 10/09/21      PT LONG TERM GOAL #4   Title Patient will report ability to void bladder absent of urinary hesitancy and intermittent stream for return to baseline bladder behavior and improved QoL.    Baseline IE: urinary hesitancy and intermittent stream    Time 12    Period Weeks    Status New    Target Date 10/09/21                    Plan - 07/17/21 1753     Clinical Impression Statement Patient is a 20 year old presenting to clinic with chief complaints of incomplete bladder emptying. Today's evaluation suggests deficits in PFM coordination, PFM extensibility, IAP management, and bladder habits as evidenced by urinary hesitancy, intermittent stream, pressure/pain with initiation of urination, lack of urinary urge, and pain with penetration. Patient's responses on FOTO outcome measures (98) indicate minimal functional  limitations/disability/distress; however the measure is limited in the assessment of incomplete emptying. Patient's progress may be limited due to presence of pelvic pain with penetration and pelvic examination prior to pregnancy; however, patient's motivation is advantageous. Patient was able to achieve basic understanding of PFM functions during today's evaluation and responded positively to educational interventions. Patient will benefit from continued skilled therapeutic intervention to address deficits in PFM coordination, PFM extensibility, IAP management, and bladder habits in order to increase function and improve overall QOL.    Personal Factors and Comorbidities Age;Behavior Pattern;Fitness;Time since onset of injury/illness/exacerbation;Sex    Examination-Activity Limitations Toileting;Sleep;Other    Examination-Participation Restrictions Interpersonal Relationship;Occupation;Community Activity    Stability/Clinical Decision Making Evolving/Moderate complexity    Clinical Decision Making Moderate    Rehab Potential Good    PT Frequency 1x / week    PT Duration 12 weeks    PT Treatment/Interventions ADLs/Self Care Home Management;Biofeedback;Cryotherapy;Electrical Stimulation;Moist Heat;Therapeutic activities;Therapeutic exercise;Neuromuscular re-education;Patient/family  education;Orthotic Fit/Training;Manual techniques;Scar mobilization;Taping;Spinal Manipulations;Joint Manipulations    PT Next Visit Plan physical assessment    PT Home Exercise Plan bladder diary; strategies for emptying handout    Consulted and Agree with Plan of Care Patient             Patient will benefit from skilled therapeutic intervention in order to improve the following deficits and impairments:  Pain, Postural dysfunction, Improper body mechanics, Impaired flexibility, Decreased strength, Decreased coordination, Decreased activity tolerance, Decreased scar mobility, Decreased endurance  Visit  Diagnosis: Other lack of coordination  Muscle weakness (generalized)     Problem List There are no problems to display for this patient.   Sheria Lang PT, DPT [phone]  07/17/2021, 6:52 PM  [place] [facility] [address] [place], [place], [address] Phone: [phone]   Fax:  [phone]  Name: Cynthia Schmitt MRN: [id number] Date of Birth: 01/28/2001

## 2021-07-22 ENCOUNTER — Encounter: Payer: Self-pay | Admitting: Obstetrics and Gynecology

## 2021-07-22 ENCOUNTER — Other Ambulatory Visit: Payer: Self-pay

## 2021-07-22 DIAGNOSIS — Z30019 Encounter for initial prescription of contraceptives, unspecified: Secondary | ICD-10-CM

## 2021-07-22 MED ORDER — SLYND 4 MG PO TABS: 1.0000 | ORAL_TABLET | Freq: Every day | ORAL | 3 refills | Status: DC

## 2021-07-24 ENCOUNTER — Other Ambulatory Visit: Payer: Self-pay

## 2021-07-24 ENCOUNTER — Ambulatory Visit: Payer: Medicaid Other | Attending: Obstetrics and Gynecology | Admitting: Physical Therapy

## 2021-07-24 ENCOUNTER — Encounter: Payer: Self-pay | Admitting: Physical Therapy

## 2021-07-24 DIAGNOSIS — R278 Other lack of coordination: Secondary | ICD-10-CM | POA: Insufficient documentation

## 2021-07-24 DIAGNOSIS — M6281 Muscle weakness (generalized): Secondary | ICD-10-CM | POA: Diagnosis present

## 2021-07-24 NOTE — Therapy (Signed)
[facility] [address] [place], [place], [address] Phone: [phone]   Fax:  [phone]  Physical Therapy Treatment  Patient Details  Name: Cynthia Schmitt MRN: [id number] Date of Birth: [birth year] Referring Provider (PT): Hildred Laser   Encounter Date: 07/24/2021   PT End of Session - 07/24/21 1752     Visit Number 2    Number of Visits 12    Date for PT Re-Evaluation 10/09/21    Authorization Type IE 07/17/2021    PT Start Time 1750    PT Stop Time 1830    PT Time Calculation (min) 40 min    Activity Tolerance Patient tolerated treatment well    Behavior During Therapy [facility] for tasks assessed/performed             History reviewed. No pertinent past medical history.  Past Surgical History:  Procedure Laterality Date   DENTAL SURGERY      There were no vitals filed for this visit.   Subjective Assessment - 07/24/21 1751     Subjective Patient reports that everything has been going good or about the same. Denies any concerns.    Currently in Pain? No/denies            TREATMENT  Pre-treatment assessment: RANGE OF MOTION:    LEFT RIGHT  Lumbar forward flexion (65):  WNL    Lumbar extension (30): WNL    Lumbar lateral flexion (25):  WNL WNL  Thoracic and Lumbar rotation (30 degrees):    WNL WNL  Hip Flexion (0-125):   WNL WNL  Hip IR (0-45):  WNL WNL  Hip ER (0-45):  WNL WNL  Hip Abduction (0-40):  WNL WNL  Hip extension (0-15):  WNL WNL    STRENGTH: MMT   RLE LLE  Hip Flexion 4 4  Hip Extension 4 4  Hip Abduction  5 5  Hip Adduction  5 5  Hip ER  5 5  Hip IR  5 5  Knee Extension 5 5  Knee Flexion 5 5  Dorsiflexion  5 5  Plantarflexion (seated) 5 5   ABDOMINAL:  Palpation: mild TTP over RUQ Diastasis: 2.5 finger at umbilicus  SPECIAL TESTS: SLR (SN 92, -LR 0.29): R: Negative L:  Negative FABER (SN 81): R: Negative L: Negative FADIR (SN 94): R: Negative L: Positive   EXTERNAL PELVIC  EXAM: Patient educated on the purpose of the pelvic exam and articulated understanding; patient consented to the exam verbally. Breath coordination: present Cued Lengthen: abdominal dominance, mild perineal descent  Cued Contraction: squeeze, no lift Cued Relaxation: present, full Cough: no perineal movement  Neuromuscular Re-education: Supine hooklying diaphragmatic breathing with VCs and TCs for downregulation of the nervous system and improved management of IAP Supine hooklying, TrA activation with coordinated breath and gentle compression for DRAM correction. VCs and TCs to decrease compensatory patterns and minimize aggravation of the lumbar paraspinals.  Patient educated throughout session on appropriate technique and form using multi-modal cueing, HEP, and activity modification. Patient articulated understanding and returned demonstration.  Patient Response to interventions: Comfortable to try new exercises this week  ASSESSMENT Patient presents to clinic with excellent motivation to participate in therapy. Patient demonstrates deficits in PFM coordination, PFM extensibility, IAP management, and bladder habits. Patient able to achieve coordinated TrA with gentle compression during today's session and responded positively to active interventions. Patient will benefit from continued skilled therapeutic intervention to address remaining deficits in PFM coordination, PFM extensibility, IAP  management, and bladder habits in order to increase function and improve overall QOL.     PT Long Term Goals - 07/17/21 1851       PT LONG TERM GOAL #1   Title Patient will demonstrate independence with HEP in order to maximize therapeutic gains and improve carryover from physical therapy sessions to ADLs in the home and community.    Baseline IE: not demonstrated    Time 12    Period Weeks    Status New    Target Date 10/09/21      PT LONG TERM GOAL #2   Title Patient will demonstrate  circumferential and sequential contraction of >4/5 MMT, > 6 sec hold x10 and 5 consecutive quick flicks with </= 10 min rest between testing bouts, and relaxation of the PFM coordinated with breath for improved management of intra-abdominal pressure and normal bowel and bladder function without the presence of pain nor incontinence in order to improve participation at home and in the community.    Baseline IE: not demonstrated    Time 12    Period Weeks    Status New    Target Date 10/09/21      PT LONG TERM GOAL #3   Title Patient will demonstrate coordinated lengthening and relaxation of PFM with diaphragmatic inhalation in order to decrease spasm and allow for unrestricted elimination of urine/feces for improved overall QOL.    Baseline IE: not demonstrated    Time 12    Period Weeks    Status New    Target Date 10/09/21      PT LONG TERM GOAL #4   Title Patient will report ability to void bladder absent of urinary hesitancy and intermittent stream for return to baseline bladder behavior and improved QoL.    Baseline IE: urinary hesitancy and intermittent stream    Time 12    Period Weeks    Status New    Target Date 10/09/21                   Plan - 07/24/21 1752     Clinical Impression Statement Patient presents to clinic with excellent motivation to participate in therapy. Patient demonstrates deficits in PFM coordination, PFM extensibility, IAP management, and bladder habits. Patient able to achieve coordinated TrA with gentle compression during today's session and responded positively to active interventions. Patient will benefit from continued skilled therapeutic intervention to address remaining deficits in PFM coordination, PFM extensibility, IAP management, and bladder habits in order to increase function and improve overall QOL.    Personal Factors and Comorbidities Age;Behavior Pattern;Fitness;Time since onset of injury/illness/exacerbation;Sex     Examination-Activity Limitations Toileting;Sleep;Other    Examination-Participation Restrictions Interpersonal Relationship;Occupation;Community Activity    Stability/Clinical Decision Making Evolving/Moderate complexity    Rehab Potential Good    PT Frequency 1x / week    PT Duration 12 weeks    PT Treatment/Interventions ADLs/Self Care Home Management;Biofeedback;Cryotherapy;Electrical Stimulation;Moist Heat;Therapeutic activities;Therapeutic exercise;Neuromuscular re-education;Patient/family education;Orthotic Fit/Training;Manual techniques;Scar mobilization;Taping;Spinal Manipulations;Joint Manipulations    PT Next Visit Plan deep core, pelvic floor downtraining    PT Home Exercise Plan bladder diary; strategies for emptying handout    Consulted and Agree with Plan of Care Patient             Patient will benefit from skilled therapeutic intervention in order to improve the following deficits and impairments:  Pain, Postural dysfunction, Improper body mechanics, Impaired flexibility, Decreased strength, Decreased coordination, Decreased activity tolerance, Decreased scar mobility, Decreased endurance  Visit Diagnosis: Other lack of coordination  Muscle weakness (generalized)     Problem List There are no problems to display for this patient.   Sheria Lang PT, DPT [phone]  07/25/2021, 9:19 AM  [facility] [address] [place], [place], [address] Phone: [phone]   Fax:  [phone]  Name: Cynthia Schmitt MRN: [id number] Date of Birth: 09/24/2000

## 2021-07-31 ENCOUNTER — Ambulatory Visit: Payer: Medicaid Other | Admitting: Physical Therapy

## 2021-07-31 ENCOUNTER — Other Ambulatory Visit: Payer: Self-pay

## 2021-08-05 ENCOUNTER — Other Ambulatory Visit: Payer: Self-pay

## 2021-08-05 ENCOUNTER — Ambulatory Visit
Admission: EM | Admit: 2021-08-05 | Discharge: 2021-08-05 | Disposition: A | Discharge status: Home / Self Care | Payer: Medicaid Other | Attending: Internal Medicine | Admitting: Internal Medicine

## 2021-08-05 ENCOUNTER — Encounter: Payer: Self-pay | Admitting: Emergency Medicine

## 2021-08-05 DIAGNOSIS — J101 Influenza due to other identified influenza virus with other respiratory manifestations: Secondary | ICD-10-CM | POA: Insufficient documentation

## 2021-08-05 DIAGNOSIS — Z20822 Contact with and (suspected) exposure to covid-19: Secondary | ICD-10-CM | POA: Insufficient documentation

## 2021-08-05 DIAGNOSIS — R059 Cough, unspecified: Secondary | ICD-10-CM | POA: Diagnosis present

## 2021-08-05 DIAGNOSIS — J09X2 Influenza due to identified novel influenza A virus with other respiratory manifestations: Secondary | ICD-10-CM | POA: Diagnosis not present

## 2021-08-05 LAB — RESP PANEL BY RT-PCR (FLU A&B, COVID) ARPGX2
Influenza A by PCR: POSITIVE — AB
Influenza B by PCR: NEGATIVE
SARS Coronavirus 2 by RT PCR: NEGATIVE

## 2021-08-05 MED ORDER — PSEUDOEPHEDRINE HCL ER 120 MG PO TB12: 120.0000 mg | ORAL_TABLET | Freq: Two times a day (BID) | ORAL | 0 refills | Status: DC

## 2021-08-05 MED ORDER — BENZONATATE 200 MG PO CAPS: 200.0000 mg | ORAL_CAPSULE | Freq: Three times a day (TID) | ORAL | 0 refills | Status: DC | PRN

## 2021-08-05 NOTE — Discharge Instructions (Signed)
You are past the 48-72 hour window to benefit from an antiviral medication for the flu since you have been sick for 5 days. Your body will continue getting better. The cough and nose congestion could last 2 weeks, but should get better as the day go on. Antibiotics will not help that, unless you get worse and you start having high fevers again, you  will need to come in to be seen.

## 2021-08-05 NOTE — ED Triage Notes (Signed)
Pt presents today with c/o high fever of 102.0 that began last week that has developed into a cough with nasal congestion. She has been using Mucinex. Pain 0/10.

## 2021-08-05 NOTE — ED Provider Notes (Signed)
[facility]    CSN: [id number] Arrival date & time: 08/05/21  1540      History   Chief Complaint Chief Complaint  Patient presents with   Cough   Nasal Congestion    HPI Cynthia Schmitt is a 20 y.o. female who presents with onset of fever of 102 and body aches and ST from bad cough 5 days ago which only lasted 24. Cough and rhinitis started 3 days ago. Denies HA or or body aches today. Today she still has stuffy nose and  productive cough of clear color. On occasion has seen small green tinge. Her energy and appetite have been down. Temp has continued around 99-100 and was 100 last night and did not go to work LMP- one year ago and is currently breast feeding and has been taking oral contraceptives     History reviewed. No pertinent past medical history.  There are no problems to display for this patient.   Past Surgical History:  Procedure Laterality Date   DENTAL SURGERY      OB History     Gravida  1   Para  1   Term  1   Preterm      AB      Living  1      SAB      IAB      Ectopic      Multiple  0   Live Births  1            Home Medications    Prior to Admission medications   Medication Sig Start Date End Date Taking? Authorizing Provider  benzonatate (TESSALON) 200 MG capsule Take 1 capsule (200 mg total) by mouth 3 (three) times daily as needed for cough. 08/05/21  Yes Rodriguez-Southworth, Nettie Elm, PA-C  Drospirenone (SLYND) 4 MG TABS Take 1 tablet by mouth daily. 07/22/21  Yes Hildred Laser, MD  Prenatal Vit-Fe Fumarate-FA (MULTIVITAMIN-PRENATAL) 27-0.8 MG TABS tablet Take 1 tablet by mouth daily at 12 noon.   Yes [provider]  pseudoephedrine (SUDAFED 12 HOUR) 120 MG 12 hr tablet Take 1 tablet (120 mg total) by mouth 2 (two) times daily. 08/05/21  Yes Rodriguez-Southworth, Nettie Elm, PA-C    Family History Family History  Problem Relation Age of Onset   Ovarian cancer Mother    Bipolar disorder Mother     Thyroid disease Mother    Healthy Father    Skin cancer Maternal Grandmother     Social History Social History   Tobacco Use   Smoking status: Never   Smokeless tobacco: Never  Vaping Use   Vaping Use: Never used  Substance Use Topics   Alcohol use: Not Currently   Drug use: Not Currently     Allergies   Patient has no known allergies.   Review of Systems Review of Systems  Constitutional:  Positive for appetite change, chills, fatigue and fever.  HENT:  Positive for congestion. Negative for ear discharge, ear pain, postnasal drip and rhinorrhea.   Respiratory:  Positive for cough.   Musculoskeletal:  Positive for myalgias.  Neurological:  Positive for headaches.    Physical Exam Triage Vital Signs ED Triage Vitals  Enc Vitals Group     BP 08/05/21 1606 107/80     Pulse Rate 08/05/21 1606 86     Resp 08/05/21 1606 18     Temp 08/05/21 1606 98.6 F (37 C)     Temp src --  SpO2 08/05/21 1606 99 %     Weight --      Height --      Head Circumference --      Peak Flow --      Pain Score 08/05/21 1604 0     Pain Loc --      Pain Edu? --      Excl. in GC? --    No data found.  Updated Vital Signs BP 107/80    Pulse 86    Temp 98.6 F (37 C)    Resp 18    SpO2 99%    Breastfeeding Yes   Visual Acuity Right Eye Distance:   Left Eye Distance:   Bilateral Distance:    Right Eye Near:   Left Eye Near:    Bilateral Near:     Physical Exam Physical Exam Vitals signs and nursing note reviewed.  Constitutional:      General: She is not in acute distress.    Appearance: Normal appearance. She is not ill-appearing, toxic-appearing or diaphoretic.  HENT:     Head: Normocephalic.     Right Ear: Tympanic membrane, ear canal and external ear normal.     Left Ear: Tympanic membrane, ear canal and external ear normal.     Nose: Nose normal.     Mouth/Throat:     Mouth: Mucous membranes are moist.  Eyes:     General: No scleral icterus.       Right eye:  No discharge.        Left eye: No discharge.     Conjunctiva/sclera: Conjunctivae normal.  Neck:     Musculoskeletal: Neck supple. No neck rigidity.  Cardiovascular:     Rate and Rhythm: Normal rate and regular rhythm.     Heart sounds: No murmur.  Pulmonary:     Effort: Pulmonary effort is normal.     Breath sounds: Normal breath sounds.  Abdominal:     General: Bowel sounds are normal. There is no distension.     Palpations: Abdomen is soft. There is no mass.     Tenderness: There is no abdominal tenderness. There is no guarding or rebound.     Hernia: No hernia is present.  Musculoskeletal: Normal range of motion.  Lymphadenopathy:     Cervical: No cervical adenopathy.  Skin:    General: Skin is warm and dry.     Coloration: Skin is not jaundiced.     Findings: No rash.  Neurological:     Mental Status: She is alert and oriented to person, place, and time.     Gait: Gait normal.  Psychiatric:        Mood and Affect: Mood normal.        Behavior: Behavior normal.        Thought Content: Thought content normal.        Judgment: Judgment normal.    UC Treatments / Results  Labs (all labs ordered are listed, but only abnormal results are displayed) Labs Reviewed  RESP PANEL BY RT-PCR (FLU A&B, COVID) ARPGX2 - Abnormal; Notable for the following components:      Result Value   Influenza A by PCR POSITIVE (*)    All other components within normal limits   Flu B and Covid test are neg EKG   Radiology No results found.  Procedures Procedures (including critical care time)  Medications Ordered in UC Medications - No data to display  Initial Impression / Assessment and Plan /  UC Course  I have reviewed the triage vital signs and the nursing notes. Pertinent labs  results that were available during my care of the patient were reviewed by me and considered in my medical decision making (see chart for details). Has Flu A but is past the 48-72h window to benefit from  Tamiflu. I prescribed her Tessalon and Sudafed for her symptoms. Gave her a work note. See instructions.  Final Clinical Impressions(s) / UC Diagnoses   Final diagnoses:  Influenza due to identified novel influenza A virus with other respiratory manifestations     Discharge Instructions      You are past the 48-72 hour window to benefit from an antiviral medication for the flu since you have been sick for 5 days. Your body will continue getting better. The cough and nose congestion could last 2 weeks, but should get better as the day go on. Antibiotics will not help that, unless you get worse and you start having high fevers again, you  will need to come in to be seen.      ED Prescriptions     Medication Sig Dispense Auth. Provider   benzonatate (TESSALON) 200 MG capsule Take 1 capsule (200 mg total) by mouth 3 (three) times daily as needed for cough. 30 capsule Rodriguez-Southworth, Nettie Elm, PA-C   pseudoephedrine (SUDAFED 12 HOUR) 120 MG 12 hr tablet Take 1 tablet (120 mg total) by mouth 2 (two) times daily. 14 tablet Rodriguez-Southworth, Nettie Elm, PA-C      PDMP not reviewed this encounter.   Garey Ham, PA-C 08/05/21 1720

## 2021-08-07 ENCOUNTER — Ambulatory Visit: Payer: Medicaid Other | Admitting: Physical Therapy

## 2021-08-07 ENCOUNTER — Encounter: Payer: Self-pay | Admitting: Physical Therapy

## 2021-08-07 ENCOUNTER — Other Ambulatory Visit: Payer: Self-pay

## 2021-08-07 DIAGNOSIS — M6281 Muscle weakness (generalized): Secondary | ICD-10-CM

## 2021-08-07 DIAGNOSIS — R278 Other lack of coordination: Secondary | ICD-10-CM

## 2021-08-07 NOTE — Therapy (Signed)
[facility] [address] [place], [place], [address] Phone: [phone]   Fax:  [phone]  Physical Therapy Treatment  Patient Details  Name: Cynthia Schmitt MRN: [id number] Date of Birth: 18-Oct-2000 Referring Provider (PT): Hildred Laser   Encounter Date: 08/07/2021   PT End of Session - 08/07/21 1807     Visit Number 3    Number of Visits 12    Date for PT Re-Evaluation 10/09/21    Authorization Type IE 07/17/2021    PT Start Time 1800    PT Stop Time 1840    PT Time Calculation (min) 40 min    Activity Tolerance Patient tolerated treatment well    Behavior During Therapy [facility] for tasks assessed/performed             History reviewed. No pertinent past medical history.  Past Surgical History:  Procedure Laterality Date   DENTAL SURGERY      There were no vitals filed for this visit.   Subjective Assessment - 08/07/21 1804     Subjective Patient states that she has not had any continued pain with urination. She does endorse that she has incomplete emptying. Patient notes she has been trying strategies for complete emptying including rocking and standing and sitting. She reports rocking offers more benefit.    Currently in Pain? No/denies            TREATMENT  Neuromuscular Re-education: Supine hooklying diaphragmatic breathing with VCs and TCs for downregulation of the nervous system and improved management of IAP Supine hooklying, TrA activation with exhalation. VCs and TCs to decrease compensatory patterns and minimize aggravation of the lumbar paraspinals.  -with marches  -with bent knee fall out Supine knee to chest with PFM lengthening, BLE, for improved PFM spasm release Supine double knee to chest with PFM lengthening for improved PFM spasm release Supine butterfly with PFM lengthening, BLE, for improved PFM tissue length   Patient educated throughout session on appropriate technique and form using  multi-modal cueing, HEP, and activity modification. Patient articulated understanding and returned demonstration.  Patient Response to interventions: Comfortable to trial HEP and return in 1 week  ASSESSMENT Patient presents to clinic with excellent motivation to participate in therapy. Patient demonstrates deficits in PFM coordination, PFM extensibility, IAP management, and bladder habits. Patient with good postural control while adding LE challenge during today's session and responded positively to active interventions. Patient will benefit from continued skilled therapeutic intervention to address remaining deficits in PFM coordination, PFM extensibility, IAP management, and bladder habits in order to increase function and improve overall QOL.    PT Long Term Goals - 07/17/21 1851       PT LONG TERM GOAL #1   Title Patient will demonstrate independence with HEP in order to maximize therapeutic gains and improve carryover from physical therapy sessions to ADLs in the home and community.    Baseline IE: not demonstrated    Time 12    Period Weeks    Status New    Target Date 10/09/21      PT LONG TERM GOAL #2   Title Patient will demonstrate circumferential and sequential contraction of >4/5 MMT, > 6 sec hold x10 and 5 consecutive quick flicks with </= 10 min rest between testing bouts, and relaxation of the PFM coordinated with breath for improved management of intra-abdominal pressure and normal bowel and bladder function without the presence of pain nor incontinence in order to improve participation at  home and in the community.    Baseline IE: not demonstrated    Time 12    Period Weeks    Status New    Target Date 10/09/21      PT LONG TERM GOAL #3   Title Patient will demonstrate coordinated lengthening and relaxation of PFM with diaphragmatic inhalation in order to decrease spasm and allow for unrestricted elimination of urine/feces for improved overall QOL.    Baseline IE: not  demonstrated    Time 12    Period Weeks    Status New    Target Date 10/09/21      PT LONG TERM GOAL #4   Title Patient will report ability to void bladder absent of urinary hesitancy and intermittent stream for return to baseline bladder behavior and improved QoL.    Baseline IE: urinary hesitancy and intermittent stream    Time 12    Period Weeks    Status New    Target Date 10/09/21                   Plan - 08/07/21 1807     Clinical Impression Statement Patient presents to clinic with excellent motivation to participate in therapy. Patient demonstrates deficits in PFM coordination, PFM extensibility, IAP management, and bladder habits. Patient with good postural control while adding LE challenge during today's session and responded positively to active interventions. Patient will benefit from continued skilled therapeutic intervention to address remaining deficits in PFM coordination, PFM extensibility, IAP management, and bladder habits in order to increase function and improve overall QOL.    Personal Factors and Comorbidities Age;Behavior Pattern;Fitness;Time since onset of injury/illness/exacerbation;Sex    Examination-Activity Limitations Toileting;Sleep;Other    Examination-Participation Restrictions Interpersonal Relationship;Occupation;Community Activity    Stability/Clinical Decision Making Evolving/Moderate complexity    Rehab Potential Good    PT Frequency 1x / week    PT Duration 12 weeks    PT Treatment/Interventions ADLs/Self Care Home Management;Biofeedback;Cryotherapy;Electrical Stimulation;Moist Heat;Therapeutic activities;Therapeutic exercise;Neuromuscular re-education;Patient/family education;Orthotic Fit/Training;Manual techniques;Scar mobilization;Taping;Spinal Manipulations;Joint Manipulations    PT Next Visit Plan deep core, pelvic floor downtraining    PT Home Exercise Plan bladder diary; strategies for emptying handout    Consulted and Agree with  Plan of Care Patient             Patient will benefit from skilled therapeutic intervention in order to improve the following deficits and impairments:  Pain, Postural dysfunction, Improper body mechanics, Impaired flexibility, Decreased strength, Decreased coordination, Decreased activity tolerance, Decreased scar mobility, Decreased endurance  Visit Diagnosis: Other lack of coordination  Muscle weakness (generalized)     Problem List There are no problems to display for this patient.   Sheria Lang PT, DPT [phone]  08/07/2021, 6:44 PM  [facility] [address] [place], [place], [address] Phone: [phone]   Fax:  [phone]  Name: Cynthia Schmitt MRN: [id number] Date of Birth: [birth year]

## 2021-08-14 ENCOUNTER — Ambulatory Visit: Payer: Medicaid Other | Admitting: Physical Therapy

## 2021-08-14 ENCOUNTER — Other Ambulatory Visit: Payer: Self-pay

## 2021-08-14 ENCOUNTER — Encounter: Payer: Self-pay | Admitting: Physical Therapy

## 2021-08-14 DIAGNOSIS — R278 Other lack of coordination: Secondary | ICD-10-CM

## 2021-08-14 DIAGNOSIS — M6281 Muscle weakness (generalized): Secondary | ICD-10-CM

## 2021-08-14 NOTE — Therapy (Signed)
[facility] [address] [place], [place], [address] Phone: [phone]   Fax:  [phone]  Physical Therapy Treatment  Patient Details  Name: Cynthia Schmitt MRN: [id number] Date of Birth: Dec 19, 2000 Referring Provider (PT): Hildred Laser   Encounter Date: 08/14/2021   PT End of Session - 08/14/21 1640     Visit Number 4    Number of Visits 12    Date for PT Re-Evaluation 10/09/21    Authorization Type IE 07/17/2021    PT Start Time 1630    PT Stop Time 1710    PT Time Calculation (min) 40 min    Activity Tolerance Patient tolerated treatment well    Behavior During Therapy [facility] for tasks assessed/performed             History reviewed. No pertinent past medical history.  Past Surgical History:  Procedure Laterality Date   DENTAL SURGERY      There were no vitals filed for this visit.   Subjective Assessment - 08/14/21 1638     Subjective Patient reports no significant changes or concerns since last session. Patient states that sometimes she feels she has completely emptied her bladder (50%), but not every single time. Patient does note that urges have come back. Patient notes that since the holiday she has not been as consistent with HEP.    Currently in Pain? No/denies            TREATMENT  Neuromuscular Re-education: Postural control in quadruped:  Cat-Camel Quadruped Transversus Abdominis Bracing Quadruped Alternating Arm Lift Quadruped Alternating Leg Extensions Bird Dog    Patient educated throughout session on appropriate technique and form using multi-modal cueing, HEP, and activity modification. Patient articulated understanding and returned demonstration.  Patient Response to interventions: Comfortable to trial HEP and return in 1 week  ASSESSMENT Patient presents to clinic with excellent motivation to participate in therapy. Patient demonstrates deficits in PFM coordination, PFM  extensibility, IAP management, and bladder habits. Patient with increased difficulty maintaining postural control in quadruped position with < 4 points of contact during today's session and responded positively to active interventions. Patient will benefit from continued skilled therapeutic intervention to address remaining deficits in PFM coordination, PFM extensibility, IAP management, and bladder habits in order to increase function and improve overall QOL.     PT Long Term Goals - 07/17/21 1851       PT LONG TERM GOAL #1   Title Patient will demonstrate independence with HEP in order to maximize therapeutic gains and improve carryover from physical therapy sessions to ADLs in the home and community.    Baseline IE: not demonstrated    Time 12    Period Weeks    Status New    Target Date 10/09/21      PT LONG TERM GOAL #2   Title Patient will demonstrate circumferential and sequential contraction of >4/5 MMT, > 6 sec hold x10 and 5 consecutive quick flicks with </= 10 min rest between testing bouts, and relaxation of the PFM coordinated with breath for improved management of intra-abdominal pressure and normal bowel and bladder function without the presence of pain nor incontinence in order to improve participation at home and in the community.    Baseline IE: not demonstrated    Time 12    Period Weeks    Status New    Target Date 10/09/21      PT LONG TERM GOAL #3   Title Patient will  demonstrate coordinated lengthening and relaxation of PFM with diaphragmatic inhalation in order to decrease spasm and allow for unrestricted elimination of urine/feces for improved overall QOL.    Baseline IE: not demonstrated    Time 12    Period Weeks    Status New    Target Date 10/09/21      PT LONG TERM GOAL #4   Title Patient will report ability to void bladder absent of urinary hesitancy and intermittent stream for return to baseline bladder behavior and improved QoL.    Baseline IE:  urinary hesitancy and intermittent stream    Time 12    Period Weeks    Status New    Target Date 10/09/21                   Plan - 08/14/21 1641     Clinical Impression Statement Patient presents to clinic with excellent motivation to participate in therapy. Patient demonstrates deficits in PFM coordination, PFM extensibility, IAP management, and bladder habits. Patient with increased difficulty maintaining postural control in quadruped position with < 4 points of contact during today's session and responded positively to active interventions. Patient will benefit from continued skilled therapeutic intervention to address remaining deficits in PFM coordination, PFM extensibility, IAP management, and bladder habits in order to increase function and improve overall QOL.    Personal Factors and Comorbidities Age;Behavior Pattern;Fitness;Time since onset of injury/illness/exacerbation;Sex    Examination-Activity Limitations Toileting;Sleep;Other    Examination-Participation Restrictions Interpersonal Relationship;Occupation;Community Activity    Stability/Clinical Decision Making Evolving/Moderate complexity    Rehab Potential Good    PT Frequency 1x / week    PT Duration 12 weeks    PT Treatment/Interventions ADLs/Self Care Home Management;Biofeedback;Cryotherapy;Electrical Stimulation;Moist Heat;Therapeutic activities;Therapeutic exercise;Neuromuscular re-education;Patient/family education;Orthotic Fit/Training;Manual techniques;Scar mobilization;Taping;Spinal Manipulations;Joint Manipulations    PT Next Visit Plan deep core, pelvic floor downtraining    PT Home Exercise Plan bladder diary; strategies for emptying handout    Consulted and Agree with Plan of Care Patient             Patient will benefit from skilled therapeutic intervention in order to improve the following deficits and impairments:  Pain, Postural dysfunction, Improper body mechanics, Impaired flexibility,  Decreased strength, Decreased coordination, Decreased activity tolerance, Decreased scar mobility, Decreased endurance  Visit Diagnosis: Other lack of coordination  Muscle weakness (generalized)     Problem List There are no problems to display for this patient.   Sheria Lang PT, DPT [phone]  08/14/2021, 5:21 PM  [place] [facility] [address] [place], [place], [address] Phone: [phone]   Fax:  [phone]  Name: Lahela Woodin MRN: [id number] Date of Birth: [birth year]

## 2021-08-21 ENCOUNTER — Other Ambulatory Visit: Payer: Self-pay

## 2021-08-21 ENCOUNTER — Ambulatory Visit: Payer: Medicaid Other | Attending: Obstetrics and Gynecology | Admitting: Physical Therapy

## 2021-08-21 ENCOUNTER — Encounter: Payer: Self-pay | Admitting: Physical Therapy

## 2021-08-21 DIAGNOSIS — M6281 Muscle weakness (generalized): Secondary | ICD-10-CM | POA: Insufficient documentation

## 2021-08-21 DIAGNOSIS — R278 Other lack of coordination: Secondary | ICD-10-CM | POA: Insufficient documentation

## 2021-08-21 NOTE — Therapy (Signed)
[place] [facility] [address] [place], [place], [address] Phone: [phone]   Fax:  [phone]  Physical Therapy Treatment  Patient Details  Name: Cynthia Schmitt MRN: [id number] Date of Birth: 2001-05-17 Referring Provider (PT): Hildred Laser   Encounter Date: 08/21/2021   PT End of Session - 08/21/21 1804     Visit Number 5    Number of Visits 12    Date for PT Re-Evaluation 10/09/21    Authorization Type IE 07/17/2021    PT Start Time 1750    PT Stop Time 1830    PT Time Calculation (min) 40 min    Activity Tolerance Patient tolerated treatment well    Behavior During Therapy [facility] for tasks assessed/performed             History reviewed. No pertinent past medical history.  Past Surgical History:  Procedure Laterality Date   DENTAL SURGERY      There were no vitals filed for this visit.   Subjective Assessment - 08/21/21 1753     Subjective Patient denies any changes or concerns since last visit. Patient notes that she may be emptying more completely more than 50% of the time but that it is still a struggle. Patient reports that urges are definitely there. Patient does note increased duration of stream. Patient notes that she has not returned to sexual activity but has significant reservations 2/2 to pain/tearing. Patient does note increased yellowish discharge with musky odor and is unsure if this is an infection of some kind or would still be considered normal post-partum discharge. Patient advised to follow up with GYN.    Currently in Pain? No/denies             TREATMENT  Neuromuscular Re-education: PFM release postures with diaphragmatic breath for improved PFM length:  Double Leg Hamstring Stretch at Wall - 2 min hold Stretching Adductors - 2 min hold Diaphragmatic Breathing in Child's Pose with Pelvic Floor Relaxation - 2 min hold  Supported puppy pose with diaphragmatic breathing and PFM relaxation- 2  min   Patient educated throughout session on appropriate technique and form using multi-modal cueing, HEP, and activity modification. Patient articulated understanding and returned demonstration.  Patient Response to interventions: Notes some burning in thighs with Child's pose  ASSESSMENT Patient presents to clinic with excellent motivation to participate in therapy. Patient demonstrates deficits in PFM coordination, PFM extensibility, IAP management, and bladder habits. Patient able to perform all PFM release postures without difficulty during today's session and responded positively to active interventions. Patient will benefit from continued skilled therapeutic intervention to address remaining deficits in PFM coordination, PFM extensibility, IAP management, and bladder habits in order to increase function and improve overall QOL.    PT Long Term Goals - 07/17/21 1851       PT LONG TERM GOAL #1   Title Patient will demonstrate independence with HEP in order to maximize therapeutic gains and improve carryover from physical therapy sessions to ADLs in the home and community.    Baseline IE: not demonstrated    Time 12    Period Weeks    Status New    Target Date 10/09/21      PT LONG TERM GOAL #2   Title Patient will demonstrate circumferential and sequential contraction of >4/5 MMT, > 6 sec hold x10 and 5 consecutive quick flicks with </= 10 min rest between testing bouts, and relaxation of the PFM coordinated with breath for improved  management of intra-abdominal pressure and normal bowel and bladder function without the presence of pain nor incontinence in order to improve participation at home and in the community.    Baseline IE: not demonstrated    Time 12    Period Weeks    Status New    Target Date 10/09/21      PT LONG TERM GOAL #3   Title Patient will demonstrate coordinated lengthening and relaxation of PFM with diaphragmatic inhalation in order to decrease spasm and  allow for unrestricted elimination of urine/feces for improved overall QOL.    Baseline IE: not demonstrated    Time 12    Period Weeks    Status New    Target Date 10/09/21      PT LONG TERM GOAL #4   Title Patient will report ability to void bladder absent of urinary hesitancy and intermittent stream for return to baseline bladder behavior and improved QoL.    Baseline IE: urinary hesitancy and intermittent stream    Time 12    Period Weeks    Status New    Target Date 10/09/21                   Plan - 08/21/21 1804     Clinical Impression Statement Patient presents to clinic with excellent motivation to participate in therapy. Patient demonstrates deficits in PFM coordination, PFM extensibility, IAP management, and bladder habits. Patient able to perform all PFM release postures without difficulty during today's session and responded positively to active interventions. Patient will benefit from continued skilled therapeutic intervention to address remaining deficits in PFM coordination, PFM extensibility, IAP management, and bladder habits in order to increase function and improve overall QOL.    Personal Factors and Comorbidities Age;Behavior Pattern;Fitness;Time since onset of injury/illness/exacerbation;Sex    Examination-Activity Limitations Toileting;Sleep;Other    Examination-Participation Restrictions Interpersonal Relationship;Occupation;Community Activity    Stability/Clinical Decision Making Evolving/Moderate complexity    Rehab Potential Good    PT Frequency 1x / week    PT Duration 12 weeks    PT Treatment/Interventions ADLs/Self Care Home Management;Biofeedback;Cryotherapy;Electrical Stimulation;Moist Heat;Therapeutic activities;Therapeutic exercise;Neuromuscular re-education;Patient/family education;Orthotic Fit/Training;Manual techniques;Scar mobilization;Taping;Spinal Manipulations;Joint Manipulations    PT Next Visit Plan deep core, pelvic floor downtraining     PT Home Exercise Plan bladder diary; strategies for emptying handout    Consulted and Agree with Plan of Care Patient             Patient will benefit from skilled therapeutic intervention in order to improve the following deficits and impairments:  Pain, Postural dysfunction, Improper body mechanics, Impaired flexibility, Decreased strength, Decreased coordination, Decreased activity tolerance, Decreased scar mobility, Decreased endurance  Visit Diagnosis: Other lack of coordination  Muscle weakness (generalized)     Problem List There are no problems to display for this patient.   Sheria Lang PT, DPT [phone]  08/21/2021, 6:25 PM  [place] [facility] [address] [place], [place], [address] Phone: [phone]   Fax:  [phone]  Name: Conswella Bruney MRN: [id number] Date of Birth: 2000/08/27

## 2021-08-28 ENCOUNTER — Encounter: Payer: Self-pay | Admitting: Physical Therapy

## 2021-08-28 ENCOUNTER — Other Ambulatory Visit: Payer: Self-pay

## 2021-08-28 ENCOUNTER — Ambulatory Visit: Payer: Medicaid Other | Admitting: Physical Therapy

## 2021-08-28 DIAGNOSIS — R278 Other lack of coordination: Secondary | ICD-10-CM

## 2021-08-28 DIAGNOSIS — M6281 Muscle weakness (generalized): Secondary | ICD-10-CM

## 2021-08-28 NOTE — Therapy (Signed)
[place] [facility] [address] [place], [place], [address] Phone: [phone]   Fax:  [phone]  Physical Therapy Treatment  Patient Details  Name: Cynthia Schmitt MRN: [id number] Date of Birth: 02-03-2001 Referring Provider (PT): Rubie Maid   Encounter Date: 08/28/2021   PT End of Session - 08/28/21 1759     Visit Number 6    Number of Visits 12    Date for PT Re-Evaluation 10/09/21    Authorization Type IE 07/17/2021    PT Start Time [id number]    PT Stop Time 1835    PT Time Calculation (min) 40 min    Activity Tolerance Patient tolerated treatment well    Behavior During Therapy [facility] for tasks assessed/performed             History reviewed. No pertinent past medical history.  Past Surgical History:  Procedure Laterality Date   DENTAL SURGERY      There were no vitals filed for this visit.   Subjective Assessment - 08/28/21 1754     Subjective Patient notes that she doesn't feel like she is having any symptoms significantly. Patient notes a little bit of trouble with the flow of urine lately (occasional/infrequent); notes sensation of incomplete emptying. Patient notes urinary hesitancy sometimes (<50% of voids); patient is able to sit with feet flat and the urine will start flowing in about 15 seconds.    Currently in Pain? No/denies             TREATMENT Manual Therapy: INTERNAL VAGINAL EXAM: Patient educated on the purpose of the pelvic exam and articulated understanding; patient consented to the exam verbally. Introitus Appears: limited; posterior fourchette elevated and obscuring introitus Skin integrity: WNL, no erythema, no edema, no ecchymosis. Normal vaginal d/c Scar mobility: well-healed scar with some puckering noted at distal portions; some restriction noted on mobilization of tissue, mild tenderness with palpation Palpation: TTP over scar site; unable to assess further 2/2 PFM spasm Scar mobilization at  perineal tissue to allow for improved mobility and function Posterior fourchette manual stretching with cued diaphragmatic breathing for improved PFM relaxation and pain modulation Scar mobilizations/MFR with bent knee fall out movement for decreased spasm and improved mobility Patient educated on self-posterior fourchette stretching for home practice to improve scar mobility and sensitivity.    Patient educated throughout session on appropriate technique and form using multi-modal cueing, HEP, and activity modification. Patient articulated understanding and returned demonstration.   Patient Response to interventions: Denies any pain or tenderness; can tell that the tissue was stretched.   ASSESSMENT Patient presents to clinic with excellent motivation to participate in therapy. Patient demonstrates deficits in PFM coordination, PFM extensibility, IAP management, and bladder habits. Patient tolerated internal exam which revealed restricted introitus due to perineal scar as well as PFM spasm upon attempt to perform internal vaginal examination of PFM during today's session but responded positively to subsequent manual interventions at scar site. Patient will benefit from continued skilled therapeutic intervention to address remaining deficits in PFM coordination, PFM extensibility, IAP management, and bladder habits in order to increase function and improve overall QOL.    PT Long Term Goals - 07/17/21 1851       PT LONG TERM GOAL #1   Title Patient will demonstrate independence with HEP in order to maximize therapeutic gains and improve carryover from physical therapy sessions to ADLs in the home and community.    Baseline IE: not demonstrated    Time 12  Period Weeks    Status New    Target Date 10/09/21      PT LONG TERM GOAL #2   Title Patient will demonstrate circumferential and sequential contraction of >4/5 MMT, > 6 sec hold x10 and 5 consecutive quick flicks with </= 10 min rest  between testing bouts, and relaxation of the PFM coordinated with breath for improved management of intra-abdominal pressure and normal bowel and bladder function without the presence of pain nor incontinence in order to improve participation at home and in the community.    Baseline IE: not demonstrated    Time 12    Period Weeks    Status New    Target Date 10/09/21      PT LONG TERM GOAL #3   Title Patient will demonstrate coordinated lengthening and relaxation of PFM with diaphragmatic inhalation in order to decrease spasm and allow for unrestricted elimination of urine/feces for improved overall QOL.    Baseline IE: not demonstrated    Time 12    Period Weeks    Status New    Target Date 10/09/21      PT LONG TERM GOAL #4   Title Patient will report ability to void bladder absent of urinary hesitancy and intermittent stream for return to baseline bladder behavior and improved QoL.    Baseline IE: urinary hesitancy and intermittent stream    Time 12    Period Weeks    Status New    Target Date 10/09/21                   Plan - 08/28/21 1840     Clinical Impression Statement Patient presents to clinic with excellent motivation to participate in therapy. Patient demonstrates deficits in PFM coordination, PFM extensibility, IAP management, and bladder habits. Patient tolerated internal exam which revealed restricted introitus due to perineal scar as well as PFM spasm upon attempt to perform internal vaginal examination of PFM during today's session but responded positively to subsequent manual interventions at scar site. Patient will benefit from continued skilled therapeutic intervention to address remaining deficits in PFM coordination, PFM extensibility, IAP management, and bladder habits in order to increase function and improve overall QOL.    Personal Factors and Comorbidities Age;Behavior Pattern;Fitness;Time since onset of injury/illness/exacerbation;Sex     Examination-Activity Limitations Toileting;Sleep;Other    Examination-Participation Restrictions Interpersonal Relationship;Occupation;Community Activity    Stability/Clinical Decision Making Evolving/Moderate complexity    Rehab Potential Good    PT Frequency 1x / week    PT Duration 12 weeks    PT Treatment/Interventions ADLs/Self Care Home Management;Biofeedback;Cryotherapy;Electrical Stimulation;Moist Heat;Therapeutic activities;Therapeutic exercise;Neuromuscular re-education;Patient/family education;Orthotic Fit/Training;Manual techniques;Scar mobilization;Taping;Spinal Manipulations;Joint Manipulations    PT Next Visit Plan deep core, pelvic floor downtraining    PT Home Exercise Plan bladder diary; strategies for emptying handout    Consulted and Agree with Plan of Care Patient             Patient will benefit from skilled therapeutic intervention in order to improve the following deficits and impairments:  Pain, Postural dysfunction, Improper body mechanics, Impaired flexibility, Decreased strength, Decreased coordination, Decreased activity tolerance, Decreased scar mobility, Decreased endurance  Visit Diagnosis: Other lack of coordination  Muscle weakness (generalized)     Problem List There are no problems to display for this patient.   Myles Gip PT, DPT [phone]  08/28/2021, 6:46 PM  [place] [facility] [address] [place], [place], [address] Phone: [phone]   Fax:  [phone]  Name: Cynthia Schmitt MRN: [id number] Date of Birth: 2000-11-07

## 2021-09-04 ENCOUNTER — Encounter: Payer: Self-pay | Admitting: Physical Therapy

## 2021-09-04 ENCOUNTER — Other Ambulatory Visit: Payer: Self-pay

## 2021-09-04 ENCOUNTER — Ambulatory Visit: Payer: Medicaid Other | Admitting: Physical Therapy

## 2021-09-04 DIAGNOSIS — R278 Other lack of coordination: Secondary | ICD-10-CM

## 2021-09-04 DIAGNOSIS — M6281 Muscle weakness (generalized): Secondary | ICD-10-CM

## 2021-09-04 NOTE — Therapy (Signed)
[facility] [address] [place], [place], [address] Phone: [phone]   Fax:  [phone]  Physical Therapy Treatment  Patient Details  Name: Cynthia Schmitt MRN: [id number]:[id number] Date of Birth: 2001-02-26 Referring Provider (PT): Rubie Maid   Encounter Date: 09/04/2021   PT End of Session - 09/04/21 1807     Visit Number 7    Number of Visits 12    Date for PT Re-Evaluation 10/09/21    Authorization Type IE 07/17/2021    PT Start Time 1800    PT Stop Time 1840    PT Time Calculation (min) 40 min    Activity Tolerance Patient tolerated treatment well    Behavior During Therapy [facility] for tasks assessed/performed             History reviewed. No pertinent past medical history.  Past Surgical History:  Procedure Laterality Date   DENTAL SURGERY      There were no vitals filed for this visit.   Subjective Assessment - 09/04/21 1802     Subjective Patient notes things have been about the same. Patient didn't do any at home manual work but did do some exercises. Patient reports that she only has to wait a couple seconds to start urinating unless at a public restroom which is her baseline.  Patient adds that urinary flow feels sustained a majority of the time and she is feeling more complete emptying. Patient denies any soreness or pain after internal exam at last session.    Currently in Pain? No/denies              TREATMENT Manual Therapy: INTERNAL VAGINAL EXAM: Patient educated on the purpose of the pelvic exam and articulated understanding; patient consented to the exam verbally. Posterior fourchette manual stretching with cued diaphragmatic breathing for improved PFM relaxation and pain modulation Scar mobilizations/MFR with bent knee fall out movement for decreased spasm and improved mobility    Patient educated throughout session on appropriate technique and form using multi-modal cueing, HEP, and activity  modification. Patient articulated understanding and returned demonstration.   Patient Response to interventions: Denies any pain or tenderness; can tell that the tissue was stretched.   ASSESSMENT Patient presents to clinic with excellent motivation to participate in therapy. Patient demonstrates deficits in PFM coordination, PFM extensibility, IAP management, and bladder habits. Patient had decreased rigidity and restriction at perineal scar s/p manual interventions during today's session and denied any increased tenderness at end of session. Patient will benefit from continued skilled therapeutic intervention to address remaining deficits in PFM coordination, PFM extensibility, IAP management, and bladder habits in order to increase function and improve overall QOL.    PT Long Term Goals - 07/17/21 1851       PT LONG TERM GOAL #1   Title Patient will demonstrate independence with HEP in order to maximize therapeutic gains and improve carryover from physical therapy sessions to ADLs in the home and community.    Baseline IE: not demonstrated    Time 12    Period Weeks    Status New    Target Date 10/09/21      PT LONG TERM GOAL #2   Title Patient will demonstrate circumferential and sequential contraction of >4/5 MMT, > 6 sec hold x10 and 5 consecutive quick flicks with </= 10 min rest between testing bouts, and relaxation of the PFM coordinated with breath for improved management of intra-abdominal pressure and normal bowel and bladder function without  the presence of pain nor incontinence in order to improve participation at home and in the community.    Baseline IE: not demonstrated    Time 12    Period Weeks    Status New    Target Date 10/09/21      PT LONG TERM GOAL #3   Title Patient will demonstrate coordinated lengthening and relaxation of PFM with diaphragmatic inhalation in order to decrease spasm and allow for unrestricted elimination of urine/feces for improved overall QOL.     Baseline IE: not demonstrated    Time 12    Period Weeks    Status New    Target Date 10/09/21      PT LONG TERM GOAL #4   Title Patient will report ability to void bladder absent of urinary hesitancy and intermittent stream for return to baseline bladder behavior and improved QoL.    Baseline IE: urinary hesitancy and intermittent stream    Time 12    Period Weeks    Status New    Target Date 10/09/21                   Plan - 09/04/21 1807     Clinical Impression Statement Patient presents to clinic with excellent motivation to participate in therapy. Patient demonstrates deficits in PFM coordination, PFM extensibility, IAP management, and bladder habits. Patient had decreased rigidity and restriction at perineal scar s/p manual interventions during today's session and denied any increased tenderness at end of session. Patient will benefit from continued skilled therapeutic intervention to address remaining deficits in PFM coordination, PFM extensibility, IAP management, and bladder habits in order to increase function and improve overall QOL.    Personal Factors and Comorbidities Age;Behavior Pattern;Fitness;Time since onset of injury/illness/exacerbation;Sex    Examination-Activity Limitations Toileting;Sleep;Other    Examination-Participation Restrictions Interpersonal Relationship;Occupation;Community Activity    Stability/Clinical Decision Making Evolving/Moderate complexity    Rehab Potential Good    PT Frequency 1x / week    PT Duration 12 weeks    PT Treatment/Interventions ADLs/Self Care Home Management;Biofeedback;Cryotherapy;Electrical Stimulation;Moist Heat;Therapeutic activities;Therapeutic exercise;Neuromuscular re-education;Patient/family education;Orthotic Fit/Training;Manual techniques;Scar mobilization;Taping;Spinal Manipulations;Joint Manipulations    PT Next Visit Plan deep core, pelvic floor downtraining    PT Home Exercise Plan bladder diary; strategies  for emptying handout    Consulted and Agree with Plan of Care Patient             Patient will benefit from skilled therapeutic intervention in order to improve the following deficits and impairments:  Pain, Postural dysfunction, Improper body mechanics, Impaired flexibility, Decreased strength, Decreased coordination, Decreased activity tolerance, Decreased scar mobility, Decreased endurance  Visit Diagnosis: Other lack of coordination  Muscle weakness (generalized)     Problem List There are no problems to display for this patient.   Myles Gip PT, DPT [phone]  09/05/2021, 9:08 AM  [facility] [address] [place], [place], [address] Phone: [phone]   Fax:  [phone]  Name: Denis Skalka MRN: [id number] Date of Birth: 12/19/2000

## 2021-09-11 ENCOUNTER — Other Ambulatory Visit: Payer: Self-pay

## 2021-09-11 ENCOUNTER — Ambulatory Visit: Payer: Medicaid Other | Admitting: Physical Therapy

## 2021-09-11 ENCOUNTER — Encounter: Payer: Self-pay | Admitting: Physical Therapy

## 2021-09-11 DIAGNOSIS — R278 Other lack of coordination: Secondary | ICD-10-CM

## 2021-09-11 DIAGNOSIS — M6281 Muscle weakness (generalized): Secondary | ICD-10-CM

## 2021-09-11 NOTE — Therapy (Signed)
[facility] [address] [place], [place], [address] Phone: [phone]   Fax:  [phone]  Physical Therapy Treatment  Patient Details  Name: Cynthia Schmitt MRN: [id number] Date of Birth: [birth year] Referring Provider (PT): Hildred Laser   Encounter Date: 09/11/2021   PT End of Session - 09/11/21 1802     Visit Number 8    Number of Visits 12    Date for PT Re-Evaluation 10/09/21    Authorization Type IE 07/17/2021    PT Start Time 1800    PT Stop Time 1840    PT Time Calculation (min) 40 min    Activity Tolerance Patient tolerated treatment well    Behavior During Therapy [facility] for tasks assessed/performed             History reviewed. No pertinent past medical history.  Past Surgical History:  Procedure Laterality Date   DENTAL SURGERY      There were no vitals filed for this visit.   Subjective Assessment - 09/11/21 1850     Subjective Patient reports that she has not had any urinary hesitancy or intermittent stream since last session. Notes that she did not have any residual pain after manual interventions with last visit. Patient does state that she has some increased stress with potential job change and new job location in [employer] which may affect her ability to participate in PT in the coming weeks.    Currently in Pain? No/denies              TREATMENT Manual Therapy: INTERNAL VAGINAL EXAM: Patient educated on the purpose of the pelvic exam and articulated understanding; patient consented to the exam verbally. Posterior fourchette manual stretching with cued diaphragmatic breathing for improved PFM relaxation and pain modulation Scar mobilizations/MFR with bent knee fall out movement for decreased spasm and improved mobility Cross finger technique at B bulbocavernosus for improved tissue extensibility and decreased pain with penetration  Neuromuscular Re-education: Reassessed goals; see below.    Patient educated throughout session on appropriate technique and form using multi-modal cueing, HEP, and activity modification. Patient articulated understanding and returned demonstration.   Patient Response to interventions: Does not report increased pain.   ASSESSMENT Patient presents to clinic with excellent motivation to participate in therapy. Patient demonstrates deficits in PFM coordination, PFM extensibility, IAP management, and bladder habits. Patient able to tolerate more aggressive stretching at perineal scar and posterior fourchette without adverse pain response during today's session and has made progress toward goal achievement. Patient will benefit from continued skilled therapeutic intervention to address remaining deficits in PFM coordination, PFM extensibility, IAP management, and bladder habits in order to increase function and improve overall QOL.     PT Long Term Goals - 09/11/21 1803       PT LONG TERM GOAL #1   Title Patient will demonstrate independence with HEP in order to maximize therapeutic gains and improve carryover from physical therapy sessions to ADLs in the home and community.    Baseline IE: not demonstrated; 1/25: IND    Time 12    Period Weeks    Status Achieved    Target Date 10/09/21      PT LONG TERM GOAL #2   Title Patient will demonstrate circumferential and sequential contraction of >4/5 MMT, > 6 sec hold x10 and 5 consecutive quick flicks with </= 10 min rest between testing bouts, and relaxation of the PFM coordinated with breath for improved management of intra-abdominal pressure  and normal bowel and bladder function without the presence of pain nor incontinence in order to improve participation at home and in the community.    Baseline IE: not demonstrated; 1/25: 2/5 MMT, x3 quick flicks, endurance not tested due to mm fatigue    Time 12    Period Weeks    Status On-going    Target Date 10/09/21      PT LONG TERM GOAL #3   Title Patient  will demonstrate coordinated lengthening and relaxation of PFM with diaphragmatic inhalation in order to decrease spasm and allow for unrestricted elimination of urine/feces for improved overall QOL.    Baseline IE: not demonstrated; 1/25: IND    Time 12    Period Weeks    Status Achieved    Target Date 10/09/21      PT LONG TERM GOAL #4   Title Patient will report ability to void bladder absent of urinary hesitancy and intermittent stream for return to baseline bladder behavior and improved QoL.    Baseline IE: urinary hesitancy and intermittent stream; 1/25: no urinary hesitancy and intermittent stream    Time 12    Period Weeks    Status Achieved    Target Date 10/09/21                   Plan - 09/11/21 1809     Clinical Impression Statement Patient presents to clinic with excellent motivation to participate in therapy. Patient demonstrates deficits in PFM coordination, PFM extensibility, IAP management, and bladder habits. Patient able to tolerate more aggressive stretching at perineal scar and posterior fourchette without adverse pain response during today's session and has made progress toward goal achievement (see goals section above for details). Patient will benefit from continued skilled therapeutic intervention to address remaining deficits in PFM coordination, PFM extensibility, IAP management, and bladder habits in order to increase function and improve overall QOL.    Personal Factors and Comorbidities Age;Behavior Pattern;Fitness;Time since onset of injury/illness/exacerbation;Sex    Examination-Activity Limitations Toileting;Sleep;Other    Examination-Participation Restrictions Interpersonal Relationship;Occupation;Community Activity    Stability/Clinical Decision Making Evolving/Moderate complexity    Rehab Potential Good    PT Frequency 1x / week    PT Duration 12 weeks    PT Treatment/Interventions ADLs/Self Care Home  Management;Biofeedback;Cryotherapy;Electrical Stimulation;Moist Heat;Therapeutic activities;Therapeutic exercise;Neuromuscular re-education;Patient/family education;Orthotic Fit/Training;Manual techniques;Scar mobilization;Taping;Spinal Manipulations;Joint Manipulations    PT Next Visit Plan deep core, pelvic floor downtraining    PT Home Exercise Plan bladder diary; strategies for emptying handout    Consulted and Agree with Plan of Care Patient             Patient will benefit from skilled therapeutic intervention in order to improve the following deficits and impairments:  Pain, Postural dysfunction, Improper body mechanics, Impaired flexibility, Decreased strength, Decreased coordination, Decreased activity tolerance, Decreased scar mobility, Decreased endurance  Visit Diagnosis: Other lack of coordination  Muscle weakness (generalized)     Problem List There are no problems to display for this patient.   Sheria Lang PT, DPT [phone]  09/11/2021, 6:54 PM  [facility] [address] [place], [place], [address] Phone: [phone]   Fax:  [phone]  Name: Caddie Randle MRN: [id number] Date of Birth: [birth year]

## 2021-09-18 ENCOUNTER — Encounter: Payer: Self-pay | Admitting: Physical Therapy

## 2021-09-18 ENCOUNTER — Ambulatory Visit: Payer: Medicaid Other | Attending: Obstetrics and Gynecology | Admitting: Physical Therapy

## 2021-09-18 ENCOUNTER — Other Ambulatory Visit: Payer: Self-pay

## 2021-09-18 DIAGNOSIS — M6281 Muscle weakness (generalized): Secondary | ICD-10-CM | POA: Insufficient documentation

## 2021-09-18 DIAGNOSIS — R102 Pelvic and perineal pain: Secondary | ICD-10-CM | POA: Diagnosis present

## 2021-09-18 DIAGNOSIS — R278 Other lack of coordination: Secondary | ICD-10-CM | POA: Insufficient documentation

## 2021-09-19 NOTE — Therapy (Signed)
[facility] [address] [place], [place], [address] Phone: [phone]   Fax:  [phone]  Physical Therapy Treatment  Patient Details  Name: Cynthia Schmitt MRN: [id number] Date of Birth: 06/05/2001 Referring Provider (PT): Hildred Laser   Encounter Date: 09/18/2021   PT End of Session - 09/18/21 1811     Visit Number 9    Number of Visits 12    Date for PT Re-Evaluation 10/09/21    Authorization Type IE 07/17/2021    PT Start Time 1810    PT Stop Time 1850    PT Time Calculation (min) 40 min    Activity Tolerance Patient tolerated treatment well    Behavior During Therapy [facility] for tasks assessed/performed             History reviewed. No pertinent past medical history.  Past Surgical History:  Procedure Laterality Date   DENTAL SURGERY      There were no vitals filed for this visit.   Subjective Assessment - 09/18/21 1814     Subjective Patient notes urinary symptoms are improved; no difficulty with emptying and no leakage. Patient reports that she has not returned to sexual activity with some fear avoidance.    Currently in Pain? No/denies             TREATMENT Neuromuscular Re-education: Patient education on circular model of female sexual response, biopsychosocial factors impacting sexual function, and fear avoidance model for sexual pain. Patient provided with handouts to reinforce concepts discussed. Patient education and demonstration of graded exposure dilator training for improved tolerance to ADL/sexual activity.   Patient educated throughout session on appropriate technique and form using multi-modal cueing, HEP, and activity modification. Patient articulated understanding and returned demonstration.   Patient Response to interventions: Does not report increased pain.   ASSESSMENT Patient presents to clinic with excellent motivation to participate in therapy. Patient demonstrates deficits in PFM  coordination, PFM extensibility, IAP management, and bladder habits. Patient amenable to education on sexual function and strategies for improved pain management with penetrative sexual activity during today's session and responded positively to educational interventions. Patient will benefit from continued skilled therapeutic intervention to address remaining deficits in PFM coordination, PFM extensibility, IAP management, and bladder habits in order to increase function and improve overall QOL.    PT Long Term Goals - 09/11/21 1803       PT LONG TERM GOAL #1   Title Patient will demonstrate independence with HEP in order to maximize therapeutic gains and improve carryover from physical therapy sessions to ADLs in the home and community.    Baseline IE: not demonstrated; 1/25: IND    Time 12    Period Weeks    Status Achieved    Target Date 10/09/21      PT LONG TERM GOAL #2   Title Patient will demonstrate circumferential and sequential contraction of >4/5 MMT, > 6 sec hold x10 and 5 consecutive quick flicks with </= 10 min rest between testing bouts, and relaxation of the PFM coordinated with breath for improved management of intra-abdominal pressure and normal bowel and bladder function without the presence of pain nor incontinence in order to improve participation at home and in the community.    Baseline IE: not demonstrated; 1/25: 2/5 MMT, x3 quick flicks, endurance not tested due to mm fatigue    Time 12    Period Weeks    Status On-going    Target Date 10/09/21  PT LONG TERM GOAL #3   Title Patient will demonstrate coordinated lengthening and relaxation of PFM with diaphragmatic inhalation in order to decrease spasm and allow for unrestricted elimination of urine/feces for improved overall QOL.    Baseline IE: not demonstrated; 1/25: IND    Time 12    Period Weeks    Status Achieved    Target Date 10/09/21      PT LONG TERM GOAL #4   Title Patient will report ability to  void bladder absent of urinary hesitancy and intermittent stream for return to baseline bladder behavior and improved QoL.    Baseline IE: urinary hesitancy and intermittent stream; 1/25: no urinary hesitancy and intermittent stream    Time 12    Period Weeks    Status Achieved    Target Date 10/09/21                   Plan - 09/18/21 1811     Clinical Impression Statement Patient presents to clinic with excellent motivation to participate in therapy. Patient demonstrates deficits in PFM coordination, PFM extensibility, IAP management, and bladder habits. Patient amenable to education on sexual function and strategies for improved pain management with penetrative sexual activity during today's session and responded positively to educational interventions. Patient will benefit from continued skilled therapeutic intervention to address remaining deficits in PFM coordination, PFM extensibility, IAP management, and bladder habits in order to increase function and improve overall QOL.    Personal Factors and Comorbidities Age;Behavior Pattern;Fitness;Time since onset of injury/illness/exacerbation;Sex    Examination-Activity Limitations Toileting;Sleep;Other    Examination-Participation Restrictions Interpersonal Relationship;Occupation;Community Activity    Stability/Clinical Decision Making Evolving/Moderate complexity    Rehab Potential Good    PT Frequency 1x / week    PT Duration 12 weeks    PT Treatment/Interventions ADLs/Self Care Home Management;Biofeedback;Cryotherapy;Electrical Stimulation;Moist Heat;Therapeutic activities;Therapeutic exercise;Neuromuscular re-education;Patient/family education;Orthotic Fit/Training;Manual techniques;Scar mobilization;Taping;Spinal Manipulations;Joint Manipulations    PT Next Visit Plan deep core, pelvic floor downtraining    PT Home Exercise Plan bladder diary; strategies for emptying handout    Consulted and Agree with Plan of Care Patient              Patient will benefit from skilled therapeutic intervention in order to improve the following deficits and impairments:  Pain, Postural dysfunction, Improper body mechanics, Impaired flexibility, Decreased strength, Decreased coordination, Decreased activity tolerance, Decreased scar mobility, Decreased endurance  Visit Diagnosis: Other lack of coordination  Muscle weakness (generalized)     Problem List There are no problems to display for this patient.   Sheria Lang PT, DPT [phone]  09/19/2021, 9:21 AM  [facility] [address] [place], [place], [address] Phone: [phone]   Fax:  [phone]  Name: Mercy Leppla MRN: [id number] Date of Birth: 11/20/2000

## 2021-09-25 ENCOUNTER — Encounter: Payer: Self-pay | Admitting: Physical Therapy

## 2021-09-25 ENCOUNTER — Ambulatory Visit: Payer: Medicaid Other | Admitting: Physical Therapy

## 2021-09-25 ENCOUNTER — Other Ambulatory Visit: Payer: Self-pay

## 2021-09-25 DIAGNOSIS — R278 Other lack of coordination: Secondary | ICD-10-CM | POA: Diagnosis not present

## 2021-09-25 DIAGNOSIS — M6281 Muscle weakness (generalized): Secondary | ICD-10-CM

## 2021-09-25 DIAGNOSIS — R102 Pelvic and perineal pain: Secondary | ICD-10-CM

## 2021-09-25 NOTE — Therapy (Signed)
[facility] [address] [place], [place], [address] Phone: [phone]   Fax:  [phone]  Physical Therapy Treatment  Patient Details  Name: Cynthia Schmitt MRN: [id number] Date of Birth: 2000-12-17 Referring Provider (PT): Rubie Maid   Encounter Date: 09/25/2021   PT End of Session - 09/25/21 1807     Visit Number 10    Number of Visits 12    Date for PT Re-Evaluation 10/09/21    Authorization Type IE 07/17/2021    PT Start Time 1802    PT Stop Time 1842    PT Time Calculation (min) 40 min    Activity Tolerance Patient tolerated treatment well    Behavior During Therapy [facility] for tasks assessed/performed             History reviewed. No pertinent past medical history.  Past Surgical History:  Procedure Laterality Date   DENTAL SURGERY      There were no vitals filed for this visit.   Subjective Assessment - 09/25/21 1805     Subjective Patient notes continued improvement with urinary symptoms. Patient denies any pain; has thought about dilator training but is waiting to start 2/2 to financial constraints.    Currently in Pain? No/denies             TREATMENT Neuromuscular Re-education: Patient education on sensate focus for improved nervous sytem downregulation with physical intimacy. Supine hooklying diaphragmatic breathing with VCs and TCs for downregulation of the nervous system and improved management of IAP Supine hooklying, PFM lengthening with inhalation. VCs and TCs to decrease compensatory patterns and encourage optimal relaxation of the PFM. Supine hooklying, PFM contractions with exhalation. VCs and TCs to decrease compensatory patterns and encourage activation of the PFM.    Patient educated throughout session on appropriate technique and form using multi-modal cueing, HEP, and activity modification. Patient articulated understanding and returned demonstration.   Patient Response to  interventions: Does not report increased pain.   ASSESSMENT Patient presents to clinic with excellent motivation to participate in therapy. Patient demonstrates deficits in PFM coordination, PFM extensibility, IAP management, and bladder habits. Patient has made considerable progress toward goal achievement and has achieved coordinated lengthening of PFM independently at this time. Patient continues to have some restriction at the perineal scar with increased pain/tenderness on internal examination. Patient continues to demonstrate fear-avoidance behaviors regarding physical intimacy 2/2 to pain, but has been responsive to extensive education on pain neuroscience, typical female sexual function, and strategies for managing pain. Patient's condition has the potential to improve in response to therapy. Maximum improvement is yet to be obtained. The anticipated improvement is attainable and reasonable in a generally predictable time. Patient will benefit from continued skilled therapeutic intervention to address remaining deficits in PFM coordination, PFM extensibility, IAP management, and bladder habits in order to increase function and improve overall QOL.     PT Long Term Goals - 09/25/21 1808       PT LONG TERM GOAL #1   Title Patient will demonstrate independence with HEP in order to maximize therapeutic gains and improve carryover from physical therapy sessions to ADLs in the home and community.    Baseline IE: not demonstrated; 1/25: IND    Time 12    Period Weeks    Status Achieved    Target Date 10/09/21      PT LONG TERM GOAL #2   Title Patient will demonstrate circumferential and sequential contraction of >4/5 MMT, > 6  sec hold x10 and 5 consecutive quick flicks with </= 10 min rest between testing bouts, and relaxation of the PFM coordinated with breath for improved management of intra-abdominal pressure and normal bowel and bladder function without the presence of pain nor incontinence  in order to improve participation at home and in the community.    Baseline IE: not demonstrated; 1/25: 2/5 MMT, x3 quick flicks, endurance not tested due to mm fatigue; 2/8: 3/5 MMT, x5 quick flicks, 6 sec x3 endurance    Time 12    Period Weeks    Status Partially Met    Target Date 10/09/21      PT LONG TERM GOAL #3   Title Patient will demonstrate coordinated lengthening and relaxation of PFM with diaphragmatic inhalation in order to decrease spasm and allow for unrestricted elimination of urine/feces for improved overall QOL.    Baseline IE: not demonstrated; 1/25: IND    Time 12    Period Weeks    Status Achieved    Target Date 10/09/21      PT LONG TERM GOAL #4   Title Patient will report ability to void bladder absent of urinary hesitancy and intermittent stream for return to baseline bladder behavior and improved QoL.    Baseline IE: urinary hesitancy and intermittent stream; 1/25: no urinary hesitancy and intermittent stream    Time 12    Period Weeks    Status Achieved    Target Date 10/09/21      PT LONG TERM GOAL #5   Title Patient will report return to all ADLs including sexual activity with pain controlled (<3/10) in order to return to PLOF and participate fully in the home.    Baseline 2/8: last recalled attempt during pregnancy 5/10    Time 4    Period Weeks    Status New    Target Date 10/23/21                   Plan - 09/25/21 1829     Clinical Impression Statement Patient presents to clinic with excellent motivation to participate in therapy. Patient demonstrates deficits in PFM coordination, PFM extensibility, IAP management, and bladder habits. Patient has made considerable progress toward goal achievement and has achieved coordinated lengthening of PFM independently at this time. Patient continues to have some restriction at the perineal scar with increased pain/tenderness on internal examination. Patient continues to demonstrate fear-avoidance  behaviors regarding physical intimacy 2/2 to pain, but has been responsive to extensive education on pain neuroscience, typical female sexual function, and strategies for managing pain. Patient's condition has the potential to improve in response to therapy. Maximum improvement is yet to be obtained. The anticipated improvement is attainable and reasonable in a generally predictable time. Patient will benefit from continued skilled therapeutic intervention to address remaining deficits in PFM coordination, PFM extensibility, IAP management, and bladder habits in order to increase function and improve overall QOL.    Personal Factors and Comorbidities Age;Behavior Pattern;Fitness;Time since onset of injury/illness/exacerbation;Sex    Examination-Activity Limitations Toileting;Sleep;Other    Examination-Participation Restrictions Interpersonal Relationship;Occupation;Community Activity    Stability/Clinical Decision Making Evolving/Moderate complexity    Rehab Potential Good    PT Frequency 1x / week    PT Duration 12 weeks    PT Treatment/Interventions ADLs/Self Care Home Management;Biofeedback;Cryotherapy;Electrical Stimulation;Moist Heat;Therapeutic activities;Therapeutic exercise;Neuromuscular re-education;Patient/family education;Orthotic Fit/Training;Manual techniques;Scar mobilization;Taping;Spinal Manipulations;Joint Manipulations    PT Next Visit Plan deep core, pelvic floor downtraining    PT Home Exercise Plan bladder  diary; strategies for emptying handout    Consulted and Agree with Plan of Care Patient             Patient will benefit from skilled therapeutic intervention in order to improve the following deficits and impairments:  Pain, Postural dysfunction, Improper body mechanics, Impaired flexibility, Decreased strength, Decreased coordination, Decreased activity tolerance, Decreased scar mobility, Decreased endurance  Visit Diagnosis: Other lack of coordination  Muscle  weakness (generalized)  Pelvic pain     Problem List There are no problems to display for this patient.   Myles Gip PT, DPT [phone]  09/26/2021, 1:22 PM   [facility] [address] [place], [place], [address] Phone: [phone]   Fax:  [phone]  Name: Cynthia Schmitt MRN: [id number] Date of Birth: 09-Nov-2000

## 2021-10-18 ENCOUNTER — Encounter: Payer: Self-pay | Admitting: Obstetrics and Gynecology

## 2021-12-10 ENCOUNTER — Encounter: Payer: Commercial Managed Care - PPO | Admitting: Obstetrics and Gynecology

## 2021-12-15 ENCOUNTER — Other Ambulatory Visit: Payer: Self-pay

## 2021-12-15 ENCOUNTER — Ambulatory Visit
Admission: EM | Admit: 2021-12-15 | Discharge: 2021-12-15 | Disposition: A | Discharge status: Home / Self Care | Payer: Medicaid Other | Attending: Physician Assistant | Admitting: Physician Assistant

## 2021-12-15 DIAGNOSIS — J020 Streptococcal pharyngitis: Secondary | ICD-10-CM | POA: Insufficient documentation

## 2021-12-15 DIAGNOSIS — R5383 Other fatigue: Secondary | ICD-10-CM | POA: Diagnosis present

## 2021-12-15 DIAGNOSIS — J029 Acute pharyngitis, unspecified: Secondary | ICD-10-CM | POA: Insufficient documentation

## 2021-12-15 LAB — GROUP A STREP BY PCR: Group A Strep by PCR: DETECTED — AB

## 2021-12-15 MED ORDER — AMOXICILLIN 500 MG PO CAPS: 500.0000 mg | ORAL_CAPSULE | Freq: Two times a day (BID) | ORAL | 0 refills | Status: AC

## 2021-12-15 NOTE — ED Triage Notes (Signed)
Patient C/O sore throat that started Friday 12/13/21. Patient states that she had a fever yesterday.  ?

## 2021-12-15 NOTE — Discharge Instructions (Addendum)
-  Strep is positive.  I sent antibiotics to pharmacy.  Take full course but you should be feeling better in the next couple of days. ?- Ibuprofen and Tylenol for discomfort, rest and fluids ?-Go to the ER if you have any uncontrollable fever, weakness or worsening sore throat or swelling. ?

## 2021-12-15 NOTE — ED Provider Notes (Signed)
?[place] [place] ? ? ? ?CSN: [id number] ?Arrival date & time: 12/15/21  0809 ? ? ?  ? ?History   ?Chief Complaint ?Chief Complaint  ?Patient presents with  ? Sore Throat  ? ? ?HPI ?Cynthia Schmitt is a 21 y.o. female presenting for 2-day history of chills and feeling feverish, severe sore throat, body aches, fatigue and congestion.  Denies cough, breathing difficulty, vomiting or diarrhea.  Reports significant pain when trying to swallow anything.  No sick contacts.  Has been taking Motrin and Mucinex.  No other complaints today. ? ?HPI ? ?History reviewed. No pertinent past medical history. ? ?There are no problems to display for this patient. ? ? ?Past Surgical History:  ?Procedure Laterality Date  ? DENTAL SURGERY    ? ? ?OB History   ? ? Gravida  ?1  ? Para  ?1  ? Term  ?1  ? Preterm  ?   ? AB  ?   ? Living  ?1  ?  ? ? SAB  ?   ? IAB  ?   ? Ectopic  ?   ? Multiple  ?0  ? Live Births  ?1  ?   ?  ?  ? ? ? ?Home Medications   ? ?Prior to Admission medications   ?Medication Sig Start Date End Date Taking? Authorizing Provider  ?amoxicillin (AMOXIL) 500 MG capsule Take 1 capsule (500 mg total) by mouth 2 (two) times daily for 10 days. 12/15/21 12/25/21 Yes Danton Clap, PA-C  ?benzonatate (TESSALON) 200 MG capsule Take 1 capsule (200 mg total) by mouth 3 (three) times daily as needed for cough. 08/05/21   Rodriguez-Southworth, Sunday Spillers, PA-C  ?Drospirenone (SLYND) 4 MG TABS Take 1 tablet by mouth daily. 07/22/21   Rubie Maid, MD  ?Prenatal Vit-Fe Fumarate-FA (MULTIVITAMIN-PRENATAL) 27-0.8 MG TABS tablet Take 1 tablet by mouth daily at 12 noon.    [provider]  ?pseudoephedrine (SUDAFED 12 HOUR) 120 MG 12 hr tablet Take 1 tablet (120 mg total) by mouth 2 (two) times daily. 08/05/21   Rodriguez-Southworth, Sunday Spillers, PA-C  ? ? ?Family History ?Family History  ?Problem Relation Age of Onset  ? Ovarian cancer Mother   ? Bipolar disorder Mother   ? Thyroid disease Mother   ? Healthy Father   ? Skin cancer  Maternal Grandmother   ? ? ?Social History ?Social History  ? ?Tobacco Use  ? Smoking status: Never  ? Smokeless tobacco: Never  ?Vaping Use  ? Vaping Use: Never used  ?Substance Use Topics  ? Alcohol use: Not Currently  ? Drug use: Not Currently  ? ? ? ?Allergies   ?Patient has no known allergies. ? ? ?Review of Systems ?Review of Systems  ?Constitutional:  Positive for chills, fatigue and fever. Negative for diaphoresis.  ?HENT:  Positive for congestion and sore throat. Negative for ear pain, rhinorrhea, sinus pressure and sinus pain.   ?Respiratory:  Negative for cough and shortness of breath.   ?Gastrointestinal:  Negative for abdominal pain, diarrhea, nausea and vomiting.  ?Musculoskeletal:  Positive for myalgias. Negative for arthralgias.  ?Skin:  Negative for rash.  ?Neurological:  Negative for weakness and headaches.  ?Hematological:  Positive for adenopathy.  ? ? ?Physical Exam ?Triage Vital Signs ?ED Triage Vitals  ?Enc Vitals Group  ?   BP   ?   Pulse   ?   Resp   ?   Temp   ?   Temp src   ?  SpO2   ?   Weight   ?   Height   ?   Head Circumference   ?   Peak Flow   ?   Pain Score   ?   Pain Loc   ?   Pain Edu?   ?   Excl. in [place]?   ? ?No data found. ? ?Updated Vital Signs ?BP 107/76 (BP Location: Left Arm)   Pulse (!) 138   Temp 99.3 ?F (37.4 ?C) (Oral)   Resp 14   Ht 5\' 6"  (1.676 m)   Wt 130 lb (59 kg)   LMP 11/14/2021   SpO2 98%   Breastfeeding No   BMI 20.98 kg/m?    ? ?Physical Exam ?Vitals and nursing note reviewed.  ?Constitutional:   ?   General: She is not in acute distress. ?   Appearance: Normal appearance. She is well-developed. She is ill-appearing. She is not toxic-appearing.  ?HENT:  ?   Head: Normocephalic and atraumatic.  ?   Nose: Congestion present.  ?   Mouth/Throat:  ?   Mouth: Mucous membranes are moist.  ?   Pharynx: Oropharynx is clear. Posterior oropharyngeal erythema present.  ?   Tonsils: Tonsillar exudate (whitish bilateral exudates) present. 3+ on the right. 3+ on the  left.  ?Eyes:  ?   General: No scleral icterus.    ?   Right eye: No discharge.     ?   Left eye: No discharge.  ?   Conjunctiva/sclera: Conjunctivae normal.  ?Cardiovascular:  ?   Rate and Rhythm: Regular rhythm. Tachycardia present.  ?   Heart sounds: Normal heart sounds.  ?Pulmonary:  ?   Effort: Pulmonary effort is normal. No respiratory distress.  ?   Breath sounds: Normal breath sounds.  ?Musculoskeletal:  ?   Cervical back: Neck supple.  ?Lymphadenopathy:  ?   Cervical: Cervical adenopathy present.  ?Skin: ?   General: Skin is dry.  ?Neurological:  ?   General: No focal deficit present.  ?   Mental Status: She is alert. Mental status is at baseline.  ?   Motor: No weakness.  ?   Gait: Gait normal.  ?Psychiatric:     ?   Mood and Affect: Mood normal.     ?   Behavior: Behavior normal.     ?   Thought Content: Thought content normal.  ? ? ? ?UC Treatments / Results  ?Labs ?(all labs ordered are listed, but only abnormal results are displayed) ?Labs Reviewed  ?GROUP A STREP BY PCR - Abnormal; Notable for the following components:  ?    Result Value  ? Group A Strep by PCR DETECTED (*)   ? All other components within normal limits  ? ? ?EKG ? ? ?Radiology ?No results found. ? ?Procedures ?Procedures (including critical care time) ? ?Medications Ordered in UC ?Medications - No data to display ? ?Initial Impression / Assessment and Plan / UC Course  ?I have reviewed the triage vital signs and the nursing notes. ? ?Pertinent labs & imaging results that were available during my care of the patient were reviewed by me and considered in my medical decision making (see chart for details). ? ?21 year old female presenting for feeling feverish, fatigue, body aches, congestion, severe sore throat for the past 2 days.  Patient is ill-appearing but nontoxic.  Pulse elevated at 138 bpm.  Presently afebrile at 99.3.  3+ bilateral tonsillar enlargement with shaggy whitish exudates bilaterally and erythema  posterior pharynx,  nasal congestion, tender and enlarged bilateral anterior cervical lymph nodes.  Chest clear to auscultation. ? ?PCR strep test obtained.  Positive strep.  Discussed results with patient.  Sent amoxicillin.  Supportive care encouraged increasing rest and fluids, over-the-counter Chloraseptic spray, throat lozenges.  Note given.  Reviewed return and ED precautions. ? ? ?Final Clinical Impressions(s) / UC Diagnoses  ? ?Final diagnoses:  ?Strep pharyngitis  ?Sore throat  ?Other fatigue  ? ? ? ?Discharge Instructions   ? ?  ?-Strep is positive.  I sent antibiotics to pharmacy.  Take full course but you should be feeling better in the next couple of days. ?- Ibuprofen and Tylenol for discomfort, rest and fluids ?-Go to the ER if you have any uncontrollable fever, weakness or worsening sore throat or swelling. ? ? ? ? ?ED Prescriptions   ? ? Medication Sig Dispense Auth. Provider  ? amoxicillin (AMOXIL) 500 MG capsule Take 1 capsule (500 mg total) by mouth 2 (two) times daily for 10 days. 20 capsule Danton Clap, PA-C  ? ?  ? ?PDMP not reviewed this encounter. ?  ?Danton Clap, PA-C ?12/15/21 0913 ? ?

## 2022-01-24 ENCOUNTER — Encounter: Payer: Commercial Managed Care - PPO | Admitting: Obstetrics and Gynecology

## 2022-02-19 ENCOUNTER — Encounter: Payer: Self-pay | Admitting: Emergency Medicine

## 2022-02-19 ENCOUNTER — Ambulatory Visit
Admission: EM | Admit: 2022-02-19 | Discharge: 2022-02-19 | Disposition: A | Discharge status: Home / Self Care | Payer: Medicaid Other | Attending: Internal Medicine | Admitting: Internal Medicine

## 2022-02-19 ENCOUNTER — Other Ambulatory Visit: Payer: Self-pay

## 2022-02-19 DIAGNOSIS — J029 Acute pharyngitis, unspecified: Secondary | ICD-10-CM | POA: Insufficient documentation

## 2022-02-19 LAB — GROUP A STREP BY PCR: Group A Strep by PCR: NOT DETECTED

## 2022-02-19 MED ORDER — IPRATROPIUM BROMIDE 0.06 % NA SOLN: 2.0000 | Freq: Four times a day (QID) | NASAL | 12 refills | Status: DC

## 2022-02-19 NOTE — ED Triage Notes (Signed)
Pt c/o sore throat. Started yesterday. She noticed tonsils are swollen, red and has white spots.

## 2022-02-19 NOTE — ED Provider Notes (Signed)
[facility]    CSN: [id number] Arrival date & time: 02/19/22  1950      History   Chief Complaint Chief Complaint  Patient presents with   Sore Throat    HPI Cynthia Schmitt is a 21 y.o. female.   HPI  21 year old female here for evaluation of sore throat.  Patient reports that she began having a sore throat last night.  This is not associated with fever, ear pain, cough, or known sick contacts.  She has had some nasal congestion, runny nose, and postnasal drip.  Patient does work in childcare.  History reviewed. No pertinent past medical history.  There are no problems to display for this patient.   Past Surgical History:  Procedure Laterality Date   DENTAL SURGERY      OB History     Gravida  1   Para  1   Term  1   Preterm      AB      Living  1      SAB      IAB      Ectopic      Multiple  0   Live Births  1            Home Medications    Prior to Admission medications   Medication Sig Start Date End Date Taking? Authorizing Provider  Drospirenone (SLYND) 4 MG TABS Take 1 tablet by mouth daily. 07/22/21  Yes Hildred Laser, MD  ipratropium (ATROVENT) 0.06 % nasal spray Place 2 sprays into both nostrils 4 (four) times daily. 02/19/22  Yes Becky Augusta, NP  Prenatal Vit-Fe Fumarate-FA (MULTIVITAMIN-PRENATAL) 27-0.8 MG TABS tablet Take 1 tablet by mouth daily at 12 noon.    [provider]    Family History Family History  Problem Relation Age of Onset   Ovarian cancer Mother    Bipolar disorder Mother    Thyroid disease Mother    Healthy Father    Skin cancer Maternal Grandmother     Social History Social History   Tobacco Use   Smoking status: Never   Smokeless tobacco: Never  Vaping Use   Vaping Use: Never used  Substance Use Topics   Alcohol use: Not Currently   Drug use: Not Currently     Allergies   Patient has no known allergies.   Review of Systems Review of Systems  Constitutional:   Negative for fever.  HENT:  Positive for congestion, rhinorrhea and sore throat. Negative for ear pain.   Respiratory:  Negative for cough.   Hematological: Negative.   Psychiatric/Behavioral: Negative.       Physical Exam Triage Vital Signs ED Triage Vitals  Enc Vitals Group     BP      Pulse      Resp      Temp      Temp src      SpO2      Weight      Height      Head Circumference      Peak Flow      Pain Score      Pain Loc      Pain Edu?      Excl. in [place]?    No data found.  Updated Vital Signs BP 116/77 (BP Location: Right Arm)   Pulse 90   Temp 98.8 F (37.1 C) (Oral)   Resp 16   Ht 5\' 6"  (1.676 m)   Wt 130 lb  1.1 oz (59 kg)   SpO2 98%   Breastfeeding No   BMI 20.99 kg/m   Visual Acuity Right Eye Distance:   Left Eye Distance:   Bilateral Distance:    Right Eye Near:   Left Eye Near:    Bilateral Near:     Physical Exam Vitals and nursing note reviewed.  Constitutional:      Appearance: Normal appearance. She is not ill-appearing.  HENT:     Head: Normocephalic and atraumatic.     Right Ear: Tympanic membrane, ear canal and external ear normal. There is no impacted cerumen.     Left Ear: Tympanic membrane, ear canal and external ear normal. There is no impacted cerumen.     Nose: Congestion and rhinorrhea present.     Mouth/Throat:     Mouth: Mucous membranes are moist.     Pharynx: Oropharyngeal exudate and posterior oropharyngeal erythema present.  Cardiovascular:     Rate and Rhythm: Normal rate and regular rhythm.     Pulses: Normal pulses.     Heart sounds: Normal heart sounds. No murmur heard.    No friction rub. No gallop.  Pulmonary:     Effort: Pulmonary effort is normal.     Breath sounds: Normal breath sounds. No wheezing, rhonchi or rales.  Musculoskeletal:     Cervical back: Normal range of motion and neck supple.  Lymphadenopathy:     Cervical: Cervical adenopathy present.  Skin:    General: Skin is warm and dry.      Capillary Refill: Capillary refill takes less than 2 seconds.     Findings: No erythema or rash.  Neurological:     General: No focal deficit present.     Mental Status: She is alert and oriented to person, place, and time.  Psychiatric:        Mood and Affect: Mood normal.        Behavior: Behavior normal.        Thought Content: Thought content normal.        Judgment: Judgment normal.      UC Treatments / Results  Labs (all labs ordered are listed, but only abnormal results are displayed) Labs Reviewed  GROUP A STREP BY PCR    EKG   Radiology No results found.  Procedures Procedures (including critical care time)  Medications Ordered in UC Medications - No data to display  Initial Impression / Assessment and Plan / UC Course  I have reviewed the triage vital signs and the nursing notes.  Pertinent labs & imaging results that were available during my care of the patient were reviewed by me and considered in my medical decision making (see chart for details).  Patient is a pleasant, nontoxic-appearing 21 year old female here for evaluation of sore throat that started yesterday and is associated with runny nose, postnasal drip, and some nasal congestion.  She denies fever, ear pain, or cough.  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  On exam patient's pearly-gray tympanic membranes bilaterally with normal light reflex and clear external auditory canals.  Nasal mucosa is erythematous edematous with copious clear discharge from both nares.  Oropharyngeal exam reveals 2+ erythematous edematous tonsillar pillars with white exudate bilaterally.  Patient also has anterior cervical of adenopathy present on exam.  Cardiopulmonary symptoms S1-S2 heart sounds with regular rate and rhythm and lung sounds are clear to auscultation in all fields.  Will check strep PCR.  Strep PCR is negative.  I  will discharge patient home with a diagnosis of viral pharyngitis.  We will  send her home with Atrovent nasal spray to help with nasal congestion postnasal drip which may be adding to her discomfort.   Final Clinical Impressions(s) / UC Diagnoses   Final diagnoses:  Viral pharyngitis     Discharge Instructions      Your strep test today was negative.  Your sore throat is most likely viral in nature.  Use the Atrovent nasal spray, 2 squirts up each nostril every 6 hours, as needed for nasal congestion and postnasal drip.  Gargle with warm salt water 2-3 times a day to soothe your throat, aid in pain relief, and aid in healing.  Take over-the-counter ibuprofen according to the package instructions as needed for pain.  You can also use Chloraseptic or Sucrets lozenges, 1 lozenge every 2 hours as needed for throat pain.  If you develop any new or worsening symptoms return for reevaluation.      ED Prescriptions     Medication Sig Dispense Auth. Provider   ipratropium (ATROVENT) 0.06 % nasal spray Place 2 sprays into both nostrils 4 (four) times daily. 15 mL Becky Augusta, NP      PDMP not reviewed this encounter.   Becky Augusta, NP 02/19/22 2029

## 2022-02-19 NOTE — Discharge Instructions (Addendum)
Your strep test today was negative.  Your sore throat is most likely viral in nature.  Use the Atrovent nasal spray, 2 squirts up each nostril every 6 hours, as needed for nasal congestion and postnasal drip.  Gargle with warm salt water 2-3 times a day to soothe your throat, aid in pain relief, and aid in healing.  Take over-the-counter ibuprofen according to the package instructions as needed for pain.  You can also use Chloraseptic or Sucrets lozenges, 1 lozenge every 2 hours as needed for throat pain.  If you develop any new or worsening symptoms return for reevaluation.

## 2022-03-31 NOTE — Progress Notes (Unsigned)
GYNECOLOGY ANNUAL PHYSICAL EXAM PROGRESS NOTE  Subjective:    Cynthia Schmitt is a 21 y.o. G17P1001 female who presents for an annual exam. The patient is sexually active. The patient participates in regular exercise: yes. Has the patient ever been transfused or tattooed?: no. The patient reports that there is not domestic violence in her life.   The patient has the following complaints today:  Reports difficulty with intercourse.  Notes superficial dyspareunia.  Has had physical therapy after birth of her son which helped some of her other issues, but still noting the dyspareunia. Notes that she had this issue also prior to childbirth but was not as bad. Current issue has been ongoing for the past 4-6 months. Denies burning, itching or discharge from the vagina.   Menstrual History: Menarche age: 48 No LMP recorded. (Menstrual status: Oral contraceptives). Period Pattern: (!) Irregular   Gynecologic History:  Contraception: oral progesterone-only contraceptive History of STI's: Denies Last Pap: 07/09/2021. Results were: normal.  Denies h/o abnormal pap smears.   OB History  Gravida Para Term Preterm AB Living  1 1 1  0 0 1  SAB IAB Ectopic Multiple Live Births  0 0 0 0 1    # Outcome Date GA Lbr Len/2nd Weight Sex Delivery Anes PTL Lv  1 Term 05/28/21 [redacted]w[redacted]d / 01:26 8 lb 6 oz (3.8 kg) M Vag-Spont EPI  LIV     Name: Alguire,BOY Edelmira     Apgar1: 6  Apgar5: 6    History reviewed. No pertinent past medical history.  Past Surgical History:  Procedure Laterality Date   DENTAL SURGERY      Family History  Problem Relation Age of Onset   Ovarian cancer Mother    Bipolar disorder Mother    Thyroid disease Mother    Healthy Father    Skin cancer Maternal Grandmother     Social History   Socioeconomic History   Marital status: Married    Spouse name: [name]   Number of children: Not on file   Years of education: Not on file   Highest education level: Not on file   Occupational History   Not on file  Tobacco Use   Smoking status: Never   Smokeless tobacco: Never  Vaping Use   Vaping Use: Never used  Substance and Sexual Activity   Alcohol use: Not Currently   Drug use: Not Currently   Sexual activity: Not Currently    Partners: Male    Birth control/protection: Pill  Other Topics Concern   Not on file  Social History Narrative   Not on file   Social Determinants of Health   Financial Resource Strain: Not on file  Food Insecurity: Not on file  Transportation Needs: Not on file  Physical Activity: Not on file  Stress: Not on file  Social Connections: Not on file  Intimate Partner Violence: Not on file    Current Outpatient Medications on File Prior to Visit  Medication Sig Dispense Refill   Drospirenone (SLYND) 4 MG TABS Take 1 tablet by mouth daily. 84 tablet 3   ipratropium (ATROVENT) 0.06 % nasal spray Place 2 sprays into both nostrils 4 (four) times daily. 15 mL 12   No current facility-administered medications on file prior to visit.    No Known Allergies   Review of Systems Constitutional: negative for chills, fatigue, fevers and sweats Eyes: negative for irritation, redness and visual disturbance Ears, nose, mouth, throat, and face: negative for hearing loss, nasal  congestion, snoring and tinnitus Respiratory: negative for asthma, cough, sputum Cardiovascular: negative for chest pain, dyspnea, exertional chest pressure/discomfort, irregular heart beat, palpitations and syncope Gastrointestinal: negative for abdominal pain, change in bowel habits, nausea and vomiting Genitourinary: negative for abnormal menstrual periods, genital lesions,  and vaginal discharge, dysuria and urinary incontinence. Positive for sexual problems (see HPI) Integument/breast: negative for breast lump, breast tenderness and nipple discharge Hematologic/lymphatic: negative for bleeding and easy bruising Musculoskeletal:negative for back pain and  muscle weakness Neurological: negative for dizziness, headaches, vertigo and weakness Endocrine: negative for diabetic symptoms including polydipsia, polyuria and skin dryness Allergic/Immunologic: negative for hay fever and urticaria      Objective:  Blood pressure 107/69, pulse 67, resp. rate 16, height 5\' 6"  (1.676 m), weight 123 lb 9.6 oz (56.1 kg), not currently breastfeeding. Body mass index is 19.95 kg/m.  General Appearance:    Alert, cooperative, no distress, appears stated age  Head:    Normocephalic, without obvious abnormality, atraumatic  Eyes:    PERRL, conjunctiva/corneas clear, EOM's intact, both eyes  Ears:    Normal external ear canals, both ears  Nose:   Nares normal, septum midline, mucosa normal, no drainage or sinus tenderness  Throat:   Lips, mucosa, and tongue normal; teeth and gums normal  Neck:   Supple, symmetrical, trachea midline, no adenopathy; thyroid: no enlargement/tenderness/nodules; no carotid bruit or JVD  Back:     Symmetric, no curvature, ROM normal, no CVA tenderness  Lungs:     Clear to auscultation bilaterally, respirations unlabored  Chest Wall:    No tenderness or deformity   Heart:    Regular rate and rhythm, S1 and S2 normal, no murmur, rub or gallop  Breast Exam:    No tenderness, masses, or nipple abnormality  Abdomen:     Soft, non-tender, bowel sounds active all four quadrants, no masses, no organomegaly.    Genitalia:    Pelvic:external genitalia normal, negative Q-tip test. Vagina without lesions, or discharge. Unable to tolerate small speculum but able to tolerate pediatric speculum with some discomfort. Tight pelvic floor noted, with tenderness of pubic arch on palpation.  Rectovaginal septum  normal. Cervix normal in appearance, no cervical motion tenderness, no adnexal masses or tenderness.  Uterus normal size, shape, mobile, regular contours, nontender.  Rectal:    Normal external sphincter.  No hemorrhoids appreciated. Internal exam not  done.   Extremities:   Extremities normal, atraumatic, no cyanosis or edema  Pulses:   2+ and symmetric all extremities  Skin:   Skin color, texture, turgor normal, no rashes or lesions  Lymph nodes:   Cervical, supraclavicular, and axillary nodes normal  Neurologic:   CNII-XII intact, normal strength, sensation and reflexes throughout   .  Labs:  Lab Results  Component Value Date   WBC 19.2 (H) 05/29/2021   HGB 11.3 (L) 05/29/2021   HCT 32.4 (L) 05/29/2021   MCV 88.8 05/29/2021   PLT 164 05/29/2021    No results found for: "CREATININE", "BUN", "NA", "K", "CL", "CO2"  No results found for: "ALT", "AST", "GGT", "ALKPHOS", "BILITOT"  No results found for: "TSH"   Assessment:   1. Well woman exam with routine gynecological exam   2. Dyspareunia in female   3. Pelvic floor tension      Plan:  1. Well woman exam with routine gynecological exam - Blood tests: CBC with diff and Comprehensive metabolic panel. - Breast self exam technique reviewed and patient encouraged to perform self-exam monthly.  Contraception: oral progesterone-only contraceptive.  Discussed healthy lifestyle modifications. - Pap smear  UTD . - Information on HPV vaccine given.  - Follow up in 1 year for annual exam  2. Dyspareunia in female - Symptoms most likely due to pelvic floor tension and pubic bone/arch tenderness. See discussion for management below.   3. Pelvic floor tension - Patient with pelvic floor tension and dyspareunia. Discussed management options including use of vaginal dilators (patient reports she has been using 2-3 days per week).  I encourage patient to either use more frequently (I.e. daily), or on days that she is unable to use her dilators to perform perineal massage.  Patient notes that she can get almost up to the last dilator in but becomes very uncomfortable with the next of the last dilator.  Also recommended use of Premarin cream to increase elasticity of the vaginal canal,  as well as trial of vaginal diazepam to decrease pelvic floor tension. -Patient to follow-up in 4 to 6 weeks to reassess symptoms.    Hildred Laser, MD [facility]

## 2022-03-31 NOTE — Patient Instructions (Incomplete)
Preventive Care 22-21 Years Old, Female Preventive care refers to lifestyle choices and visits with your health care provider that can promote health and wellness. At this stage in your life, you may start seeing a primary care physician instead of a pediatrician for your preventive care. Preventive care visits are also called wellness exams. What can I expect for my preventive care visit? Counseling During your preventive care visit, your health care provider may ask about your: Medical history, including: Past medical problems. Family medical history. Pregnancy history. Current health, including: Menstrual cycle. Method of birth control. Emotional well-being. Home life and relationship well-being. Sexual activity and sexual health. Lifestyle, including: Alcohol, nicotine or tobacco, and drug use. Access to firearms. Diet, exercise, and sleep habits. Sunscreen use. Motor vehicle safety. Physical exam Your health care provider may check your: Height and weight. These may be used to calculate your BMI (body mass index). BMI is a measurement that tells if you are at a healthy weight. Waist circumference. This measures the distance around your waistline. This measurement also tells if you are at a healthy weight and may help predict your risk of certain diseases, such as type 2 diabetes and high blood pressure. Heart rate and blood pressure. Body temperature. Skin for abnormal spots. Breasts. What immunizations do I need?  Vaccines are usually given at various ages, according to a schedule. Your health care provider will recommend vaccines for you based on your age, medical history, and lifestyle or other factors, such as travel or where you work. What tests do I need? Screening Your health care provider may recommend screening tests for certain conditions. This may include: Vision and hearing tests. Lipid and cholesterol levels. Pelvic exam and Pap test. Hepatitis B  test. Hepatitis C test. HIV (human immunodeficiency virus) test. STI (sexually transmitted infection) testing, if you are at risk. Tuberculosis skin test if you have symptoms. BRCA-related cancer screening. This may be done if you have a family history of breast, ovarian, tubal, or peritoneal cancers. Talk with your health care provider about your test results, treatment options, and if necessary, the need for more tests. Follow these instructions at home: Eating and drinking  Eat a healthy diet that includes fresh fruits and vegetables, whole grains, lean protein, and low-fat dairy products. Drink enough fluid to keep your urine pale yellow. Do not drink alcohol if: Your health care provider tells you not to drink. You are pregnant, may be pregnant, or are planning to become pregnant. You are under the legal drinking age. In the [place] the legal drinking age is 22. If you drink alcohol: Limit how much you have to 0-1 drink a day. Know how much alcohol is in your drink. In the [place] one drink equals one 12 oz bottle of beer (355 mL), one 5 oz glass of wine (148 mL), or one 1 oz glass of hard liquor (44 mL). Lifestyle Brush your teeth every morning and night with fluoride toothpaste. Floss one time each day. Exercise for at least 30 minutes 5 or more days of the week. Do not use any products that contain nicotine or tobacco. These products include cigarettes, chewing tobacco, and vaping devices, such as e-cigarettes. If you need help quitting, ask your health care provider. Do not use drugs. If you are sexually active, practice safe sex. Use a condom or other form of protection to prevent STIs. If you do not wish to become pregnant, use a form of birth control. If you plan to become pregnant,   see your health care provider for a prepregnancy visit. Find healthy ways to manage stress, such as: Meditation, yoga, or listening to music. Journaling. Talking to a trusted person. Spending time  with friends and family. Safety Always wear your seat belt while driving or riding in a vehicle. Do not drive: If you have been drinking alcohol. Do not ride with someone who has been drinking. When you are tired or distracted. While texting. If you have been using any mind-altering substances or drugs. Wear a helmet and other protective equipment during sports activities. If you have firearms in your house, make sure you follow all gun safety procedures. Seek help if you have been bullied, physically abused, or sexually abused. Use the internet responsibly to avoid dangers, such as online bullying and online sex predators. What's next? Go to your health care provider once a year for an annual wellness visit. Ask your health care provider how often you should have your eyes and teeth checked. Stay up to date on all vaccines. This information is not intended to replace advice given to you by your health care provider. Make sure you discuss any questions you have with your health care provider. Document Revised: 01/30/2021 Document Reviewed: 01/30/2021 Elsevier Patient Education  2023 [employer]vier Inc.  

## 2022-04-01 ENCOUNTER — Telehealth: Payer: Self-pay | Admitting: Obstetrics and Gynecology

## 2022-04-01 ENCOUNTER — Ambulatory Visit (INDEPENDENT_AMBULATORY_CARE_PROVIDER_SITE_OTHER): Payer: Commercial Managed Care - PPO | Admitting: Obstetrics and Gynecology

## 2022-04-01 ENCOUNTER — Encounter: Payer: Self-pay | Admitting: Obstetrics and Gynecology

## 2022-04-01 VITALS — BP 107/69 | HR 67 | Resp 16 | Ht 66.0 in | Wt 123.6 lb

## 2022-04-01 DIAGNOSIS — N941 Unspecified dyspareunia: Secondary | ICD-10-CM

## 2022-04-01 DIAGNOSIS — M6289 Other specified disorders of muscle: Secondary | ICD-10-CM

## 2022-04-01 DIAGNOSIS — Z01419 Encounter for gynecological examination (general) (routine) without abnormal findings: Secondary | ICD-10-CM

## 2022-04-01 MED ORDER — DIAZEPAM 5 MG PO TABS: ORAL_TABLET | ORAL | 3 refills | Status: DC

## 2022-04-01 MED ORDER — PREMARIN 0.625 MG/GM VA CREA: TOPICAL_CREAM | VAGINAL | 3 refills | Status: DC

## 2022-04-01 NOTE — Telephone Encounter (Signed)
[facility] calling diazepam (VALIUM) 5 MG tablet it is saying apply every night vaginal for one month. Please advise the correct way patient is supposed to take this?

## 2022-04-01 NOTE — Telephone Encounter (Signed)
Called pharmacy to clarify prescription. Insert vaginally instead of Apply. Pharmacist will make correction.

## 2022-04-02 LAB — COMPREHENSIVE METABOLIC PANEL
ALT: 16 IU/L (ref 0–32)
AST: 16 IU/L (ref 0–40)
Albumin/Globulin Ratio: 1.9 (ref 1.2–2.2)
Albumin: 4.8 g/dL (ref 4.0–5.0)
Alkaline Phosphatase: 100 IU/L (ref 44–121)
BUN/Creatinine Ratio: 13 (ref 9–23)
BUN: 10 mg/dL (ref 6–20)
Bilirubin Total: 0.9 mg/dL (ref 0.0–1.2)
CO2: 21 mmol/L (ref 20–29)
Calcium: 9.5 mg/dL (ref 8.7–10.2)
Chloride: 104 mmol/L (ref 96–106)
Creatinine, Ser: 0.79 mg/dL (ref 0.57–1.00)
Globulin, Total: 2.5 g/dL (ref 1.5–4.5)
Glucose: 85 mg/dL (ref 70–99)
Potassium: 4.1 mmol/L (ref 3.5–5.2)
Sodium: 141 mmol/L (ref 134–144)
Total Protein: 7.3 g/dL (ref 6.0–8.5)
eGFR: 109 mL/min/{1.73_m2} (ref >59)

## 2022-04-02 LAB — CBC
Hematocrit: 39.8 % (ref 34.0–46.6)
Hemoglobin: 13.1 g/dL (ref 11.1–15.9)
MCH: 28.5 pg (ref 26.6–33.0)
MCHC: 32.9 g/dL (ref 31.5–35.7)
MCV: 87 fL (ref 79–97)
Platelets: 196 10*3/uL (ref 150–450)
RBC: 4.59 x10E6/uL (ref 3.77–5.28)
RDW: 12 % (ref 11.7–15.4)
WBC: 7.1 10*3/uL (ref 3.4–10.8)

## 2022-05-13 ENCOUNTER — Encounter: Payer: Commercial Managed Care - PPO | Admitting: Obstetrics and Gynecology

## 2022-06-05 ENCOUNTER — Encounter: Payer: Self-pay | Admitting: Obstetrics and Gynecology

## 2022-06-05 DIAGNOSIS — Z30019 Encounter for initial prescription of contraceptives, unspecified: Secondary | ICD-10-CM

## 2022-06-06 MED ORDER — SLYND 4 MG PO TABS: 1.0000 | ORAL_TABLET | Freq: Every day | ORAL | 3 refills | Status: DC

## 2022-07-18 IMAGING — US US OB COMP +14 WK
1 series · 13 of 28 positions shown · non-contrast
Comparison: none

CLINICAL DATA: Second trimester pregnancy for fetal anatomy survey.

EXAM:
OBSTETRICAL ULTRASOUND >14 WKS

[Series 1: us ob comp + 14 wk · 13 of 90 slices shown]
[im 4/90]
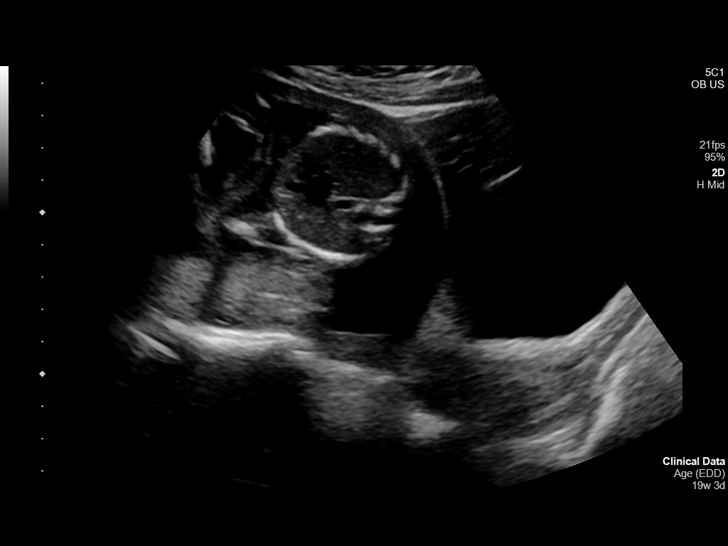
[im 10/90]
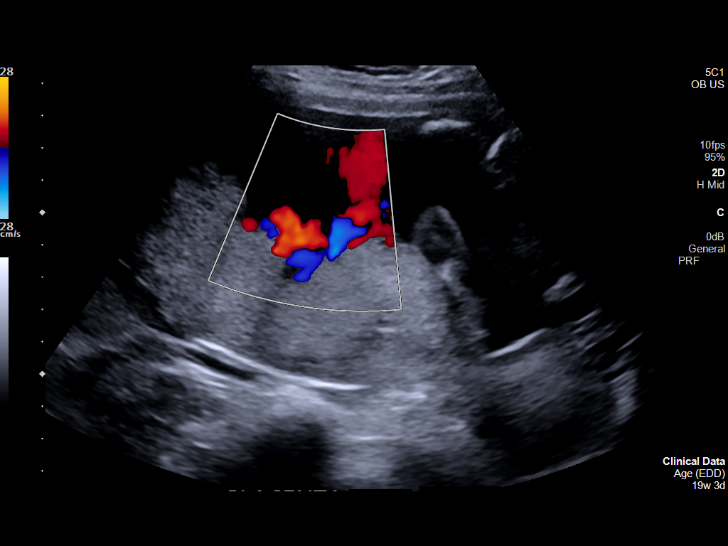
[im 17/90]
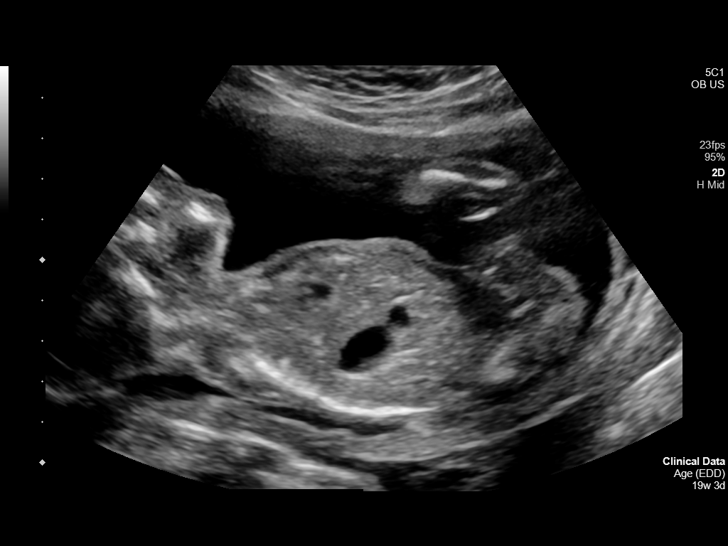
[im 24/90]
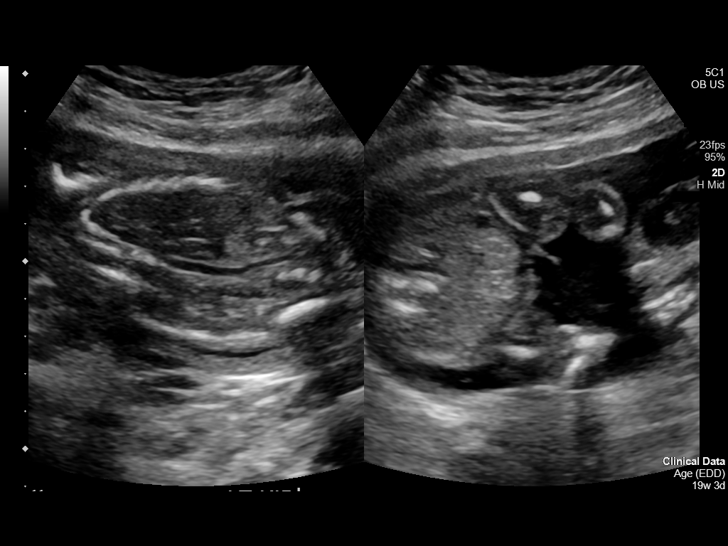
[im 30/90]
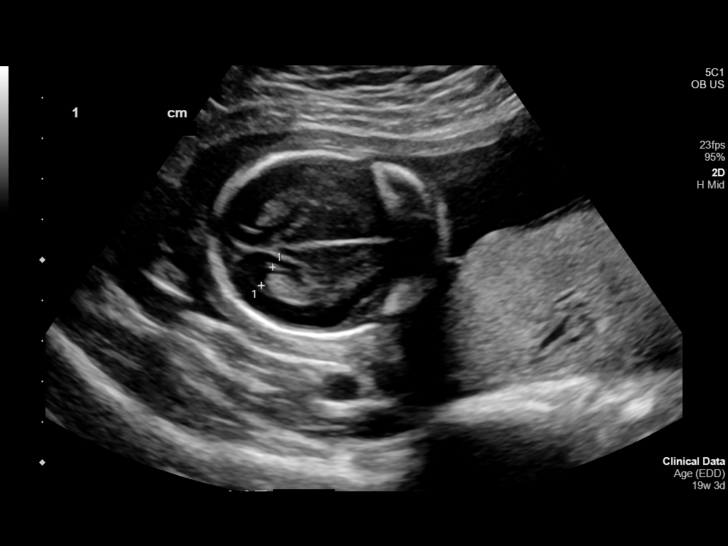
[im 37/90]
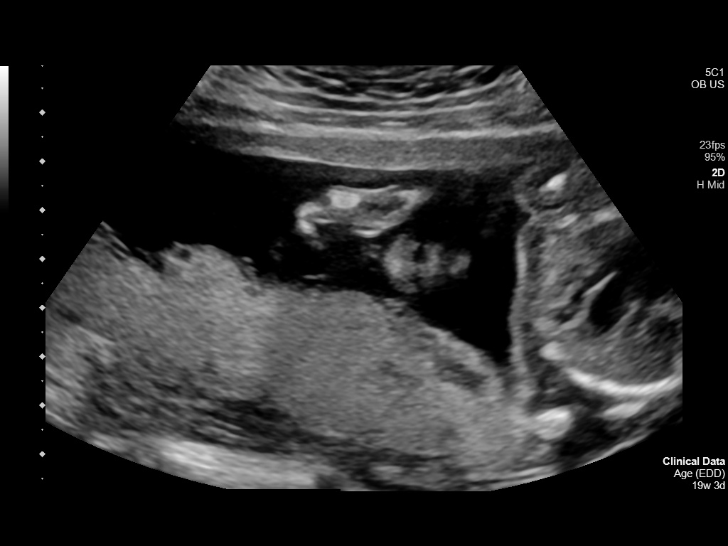
[im 47/90]
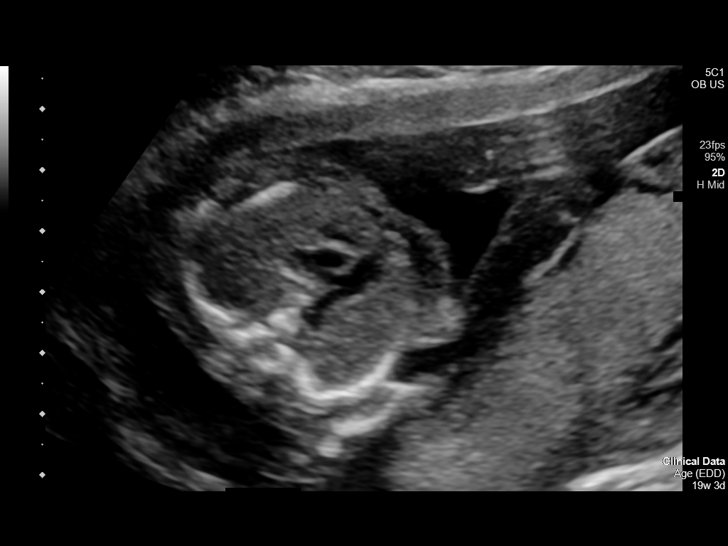
[im 53/90]
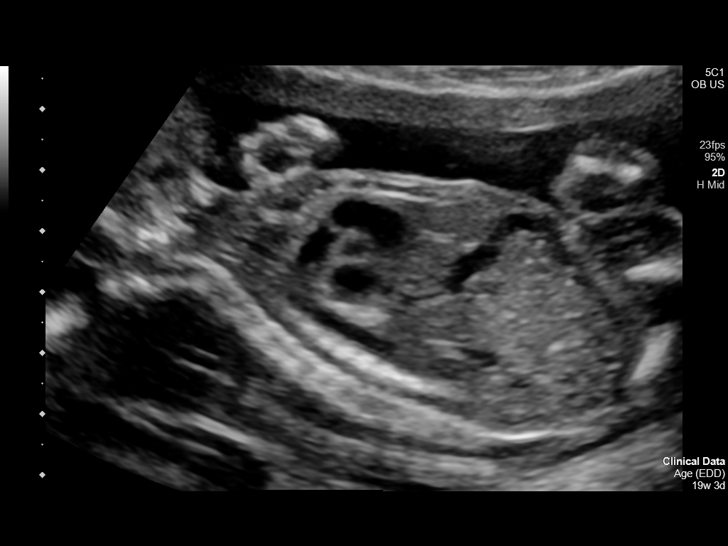
[im 60/90]
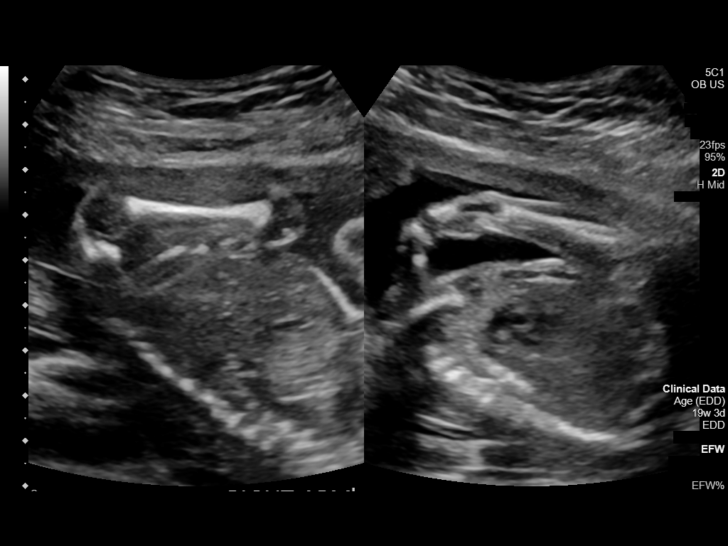
[im 66/90]
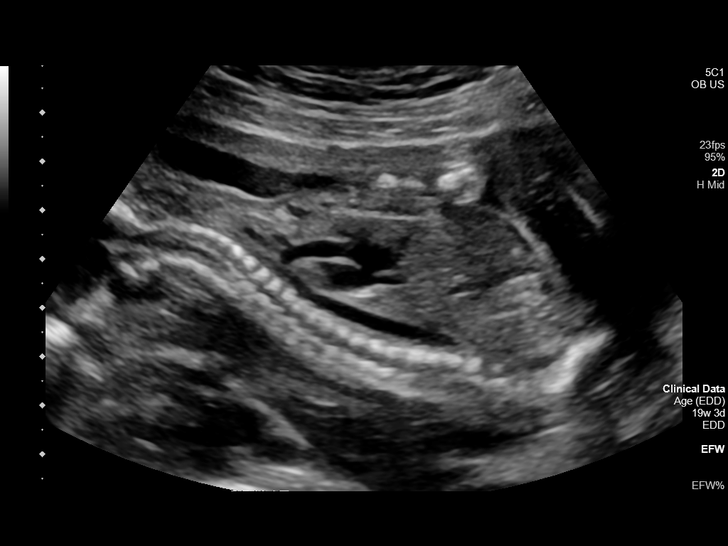
[im 73/90]
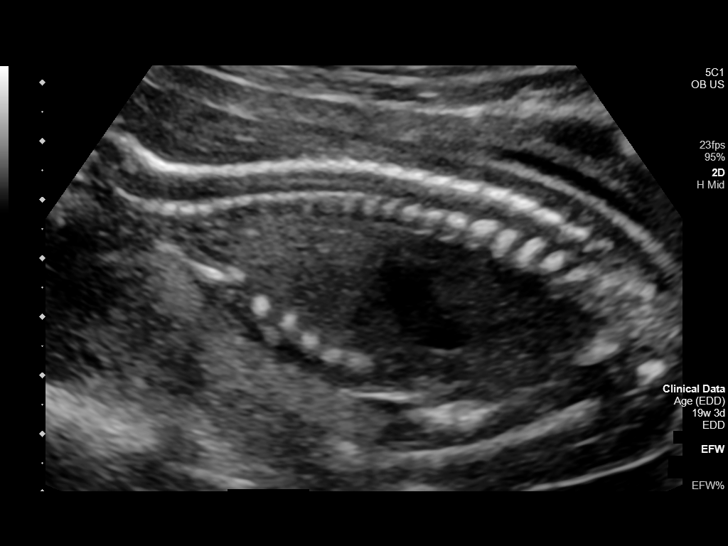
[im 80/90]
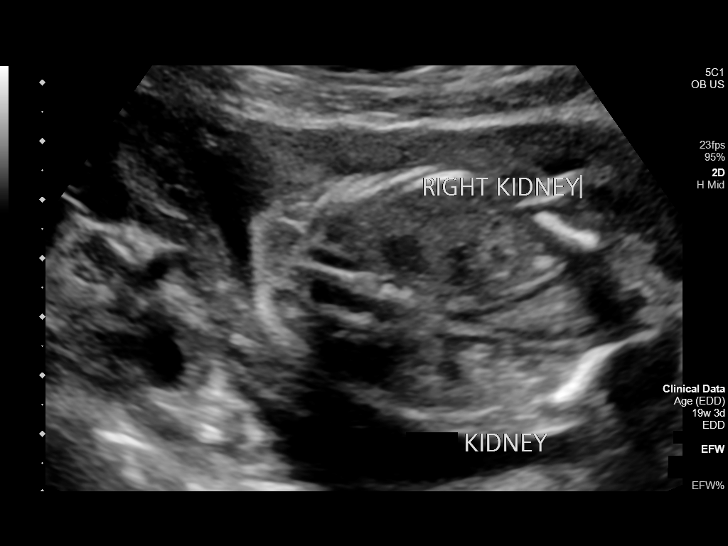
[im 86/90]
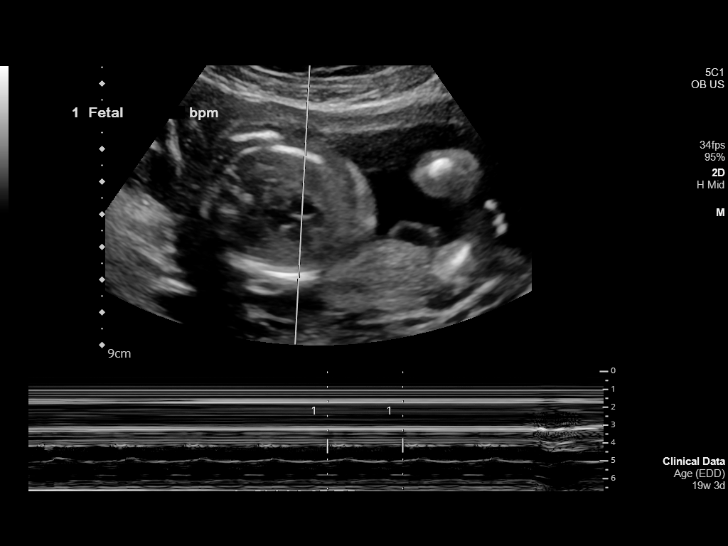

[13 of 28 positions shown; findings below may reference images not displayed]

FINDINGS: Number of Fetuses: 1

Heart Rate:  136 bpm

Movement: Yes

Presentation: Breech

Previa: No

Placental Location: Posterior

Amniotic Fluid (Subjective): Within normal limits

Amniotic Fluid (Objective):

Vertical pocket = 4.4cm

FETAL BIOMETRY

BPD: 4.5cm 19w 3d

HC:   17.3cm 19w 6d

AC:   15.0cm 20w 2d

FL:   3.3cm 20w 3d

Current Mean GA: 19w 6d US EDC: 05/30/2021

Assigned GA:  19w 3d Assigned EDC: 06/03/2021

FETAL ANATOMY

Lateral Ventricles: Appears normal

Thalami/CSP: Appears normal

Posterior Fossa:  Appears normal

Nuchal Region: Appears normal   NFT= 3.8 mm

Upper Lip: Appears normal

Spine: Appears normal

4 Chamber Heart on Left: Appears normal

LVOT: Appears normal

RVOT: Appears normal

Stomach on Left: Appears normal

3 Vessel Cord: Appears normal

Cord Insertion site: Appears normal

Kidneys: Appears normal

Bladder: Appears normal

Extremities: Appears normal

Sex: Male

Maternal Findings:

Cervix:  4.2 cm in length
IMPRESSION: Assigned GA currently 19 weeks 3 days.  Appropriate fetal growth.

Unremarkable fetal anatomic survey.  No fetal anomalies identified.

## 2022-08-18 ENCOUNTER — Encounter: Payer: Self-pay | Admitting: Obstetrics and Gynecology

## 2022-08-19 ENCOUNTER — Other Ambulatory Visit: Payer: Self-pay | Admitting: Obstetrics and Gynecology

## 2022-08-19 DIAGNOSIS — Z30019 Encounter for initial prescription of contraceptives, unspecified: Secondary | ICD-10-CM

## 2023-03-03 ENCOUNTER — Ambulatory Visit
Admission: EM | Admit: 2023-03-03 | Discharge: 2023-03-03 | Disposition: A | Discharge status: Home / Self Care | Payer: Medicaid Other | Attending: Emergency Medicine | Admitting: Emergency Medicine

## 2023-03-03 DIAGNOSIS — J020 Streptococcal pharyngitis: Secondary | ICD-10-CM | POA: Insufficient documentation

## 2023-03-03 LAB — GROUP A STREP BY PCR: Group A Strep by PCR: DETECTED — AB

## 2023-03-03 MED ORDER — PREDNISONE 20 MG PO TABS: 40.0000 mg | ORAL_TABLET | Freq: Every day | ORAL | 0 refills | Status: DC

## 2023-03-03 MED ORDER — AMOXICILLIN 500 MG PO CAPS: 500.0000 mg | ORAL_CAPSULE | Freq: Two times a day (BID) | ORAL | 0 refills | Status: AC

## 2023-03-03 NOTE — ED Provider Notes (Signed)
[facility]    CSN: [id number] Arrival date & time: 03/03/23  1204      History   Chief Complaint Chief Complaint  Patient presents with   Sore Throat   Fatigue   Congestion    HPI Cynthia Schmitt is a 22 y.o. female.   Patient presents for evaluation of chills, nasal congestion, rhinorrhea, left-sided ear pain, sore throat and nausea without vomiting beginning 1 day ago.  Subjective fever starting today approximately around 11 AM.  Difficulty tolerating food but able to tolerate liquids.  No known sick contacts but spouse has similar symptoms.  Has attempted use of Tylenol which has been minimally effective.    History reviewed. No pertinent past medical history.  There are no problems to display for this patient.   Past Surgical History:  Procedure Laterality Date   DENTAL SURGERY      OB History     Gravida  1   Para  1   Term  1   Preterm      AB      Living  1      SAB      IAB      Ectopic      Multiple  0   Live Births  1            Home Medications    Prior to Admission medications   Medication Sig Start Date End Date Taking? Authorizing Provider  conjugated estrogens (PREMARIN) vaginal cream Use 2-3 times weekly vaginally . 04/01/22  Yes Hildred Laser, MD  diazepam (VALIUM) 5 MG tablet Apply every night vaginally for 1 month, then decrease to twice weekly. 04/01/22  Yes Hildred Laser, MD  Drospirenone (SLYND) 4 MG TABS Take 1 tablet by mouth daily. 06/06/22  Yes Hildred Laser, MD  ipratropium (ATROVENT) 0.06 % nasal spray Place 2 sprays into both nostrils 4 (four) times daily. 02/19/22   Becky Augusta, NP    Family History Family History  Problem Relation Age of Onset   Ovarian cancer Mother    Bipolar disorder Mother    Thyroid disease Mother    Healthy Father    Skin cancer Maternal Grandmother     Social History Social History   Tobacco Use   Smoking status: Never   Smokeless tobacco: Never  Vaping Use    Vaping status: Never Used  Substance Use Topics   Alcohol use: Not Currently   Drug use: Not Currently     Allergies   Patient has no known allergies.   Review of Systems Review of Systems  Constitutional:  Positive for chills and fever. Negative for activity change, appetite change, diaphoresis, fatigue and unexpected weight change.  HENT:  Positive for congestion, ear pain, rhinorrhea and sore throat. Negative for dental problem, drooling, ear discharge, facial swelling, hearing loss, mouth sores, nosebleeds, postnasal drip, sinus pressure, sinus pain, sneezing, tinnitus, trouble swallowing and voice change.   Respiratory: Negative.    Cardiovascular: Negative.   Gastrointestinal: Negative.   Musculoskeletal:  Positive for myalgias. Negative for arthralgias, back pain, gait problem, joint swelling, neck pain and neck stiffness.  Neurological: Negative.      Physical Exam Triage Vital Signs ED Triage Vitals  Encounter Vitals Group     BP 03/03/23 1217 109/79     Systolic BP Percentile --      Diastolic BP Percentile --      Pulse Rate 03/03/23 1217 (!) 122     Resp 03/03/23  1217 16     Temp 03/03/23 1217 (!) 101 F (38.3 C)     Temp Source 03/03/23 1217 Oral     SpO2 03/03/23 1217 94 %     Weight 03/03/23 1216 122 lb (55.3 kg)     Height 03/03/23 1216 5\' 6"  (1.676 m)     Head Circumference --      Peak Flow --      Pain Score 03/03/23 1219 7     Pain Loc --      Pain Education --      Exclude from Growth Chart --    No data found.  Updated Vital Signs BP 109/79 (BP Location: Left Arm)   Pulse (!) 122   Temp (!) 101 F (38.3 C) (Oral)   Resp 16   Ht 5\' 6"  (1.676 m)   Wt 122 lb (55.3 kg)   SpO2 94%   BMI 19.69 kg/m   Visual Acuity Right Eye Distance:   Left Eye Distance:   Bilateral Distance:    Right Eye Near:   Left Eye Near:    Bilateral Near:     Physical Exam Constitutional:      Appearance: Normal appearance. She is well-developed.  HENT:      Head: Normocephalic.     Right Ear: Tympanic membrane and ear canal normal.     Left Ear: Tympanic membrane and ear canal normal.     Nose: Congestion present. No rhinorrhea.     Mouth/Throat:     Pharynx: Posterior oropharyngeal erythema present.     Tonsils: Tonsillar exudate present. 4+ on the right. 4+ on the left.  Eyes:     Extraocular Movements: Extraocular movements intact.  Cardiovascular:     Rate and Rhythm: Regular rhythm. Tachycardia present.     Pulses: Normal pulses.     Heart sounds: Normal heart sounds.  Pulmonary:     Effort: Pulmonary effort is normal.     Breath sounds: Normal breath sounds.  Skin:    General: Skin is warm and dry.  Neurological:     Mental Status: She is alert and oriented to person, place, and time. Mental status is at baseline.      UC Treatments / Results  Labs (all labs ordered are listed, but only abnormal results are displayed) Labs Reviewed  GROUP A STREP BY PCR - Abnormal; Notable for the following components:      Result Value   Group A Strep by PCR DETECTED (*)    All other components within normal limits    EKG   Radiology No results found.  Procedures Procedures (including critical care time)  Medications Ordered in UC Medications - No data to display  Initial Impression / Assessment and Plan / UC Course  I have reviewed the triage vital signs and the nursing notes.  Pertinent labs & imaging results that were available during my care of the patient were reviewed by me and considered in my medical decision making (see chart for details).  Strep pharyngitis  Fever of 101 with associated tachycardia noted in triage, patient ill-appearing but in no signs of distress, erythema with significant tonsillar adenopathy and exudate on exam, adenoids able to touch the uvula, no respiratory symptoms at this time, prescribed amoxicillin and prednisone and recommended supportive care for remaining symptoms, given strict  precautions for any signs of difficulty breathing she is to go to the nearest emergency department for immediate evaluation, verbalized understanding, otherwise may follow-up with  urgent care as needed, work note given Final Clinical Impressions(s) / UC Diagnoses   Final diagnoses:  Strep pharyngitis     Discharge Instructions      Your rapid strep test today was positive  Take amoxicillin twice a day for the next 10 days, daily will see improvement in about 48 hours and steady progression from there  To be use of salt gargles throat lozenges, warm liquids, teaspoons of honey and over-the-counter clippers septic spray for comfort  May give Tylenol or Motrin every 6 hours as needed for additional comfort  You may follow-up at urgent care as needed      ED Prescriptions   None    PDMP not reviewed this encounter.   Valinda Hoar, NP 03/03/23 1310

## 2023-03-03 NOTE — Discharge Instructions (Addendum)
Your rapid strep test today was positive  Take amoxicillin twice a day for the next 10 days, daily will see improvement in about 48 hours and steady progression from there  On exam your tonsils are large enough to touch your uvula which hangs in the middle of your throat therefore start prednisone every morning for 5 days and efforts to help reduce this  At any point if you begin to have any difficulty breathing go straight to the emergency department for evaluation  To be use of salt gargles throat lozenges, warm liquids, teaspoons of honey and over-the-counter clippers septic spray for comfort  May give Tylenol or Motrin every 6 hours as needed for additional comfort  You may follow-up at urgent care as needed

## 2023-03-03 NOTE — ED Triage Notes (Signed)
Pt c/o sore throat,congestion & fatigue x1 day. Denies any fevers. Has tried tylenol & ibuprofen w/o relief.

## 2023-03-24 ENCOUNTER — Encounter: Payer: Self-pay | Admitting: Obstetrics and Gynecology

## 2023-04-01 ENCOUNTER — Ambulatory Visit: Payer: Medicaid Other

## 2023-04-02 ENCOUNTER — Ambulatory Visit
Admission: RE | Admit: 2023-04-02 | Discharge: 2023-04-02 | Disposition: A | Discharge status: Home / Self Care | Payer: Medicaid Other | Source: Ambulatory Visit | Attending: Family Medicine | Admitting: Family Medicine

## 2023-04-02 ENCOUNTER — Other Ambulatory Visit: Payer: Self-pay

## 2023-04-02 VITALS — BP 112/81 | HR 110 | Temp 99.5°F | Resp 18

## 2023-04-02 DIAGNOSIS — J020 Streptococcal pharyngitis: Secondary | ICD-10-CM | POA: Diagnosis not present

## 2023-04-02 LAB — GROUP A STREP BY PCR: Group A Strep by PCR: DETECTED — AB

## 2023-04-02 MED ORDER — LIDOCAINE VISCOUS HCL 2 % MT SOLN: 15.0000 mL | OROMUCOSAL | 0 refills | Status: DC | PRN

## 2023-04-02 MED ORDER — CEFDINIR 300 MG PO CAPS: 300.0000 mg | ORAL_CAPSULE | Freq: Two times a day (BID) | ORAL | 0 refills | Status: AC

## 2023-04-02 NOTE — ED Triage Notes (Signed)
Pt c/o sore throat x 3 days with chills pt states she has pus pockets on the back of her throat and denies fever

## 2023-04-02 NOTE — ED Provider Notes (Signed)
[facility]    CSN: [id number] Arrival date & time: 04/02/23  1108      History   Chief Complaint Chief Complaint  Patient presents with   Oral Swelling    I think I may have strep again. I had it last month as well. - Entered by patient   Sore Throat    HPI Evaleen Cranston is a 22 y.o. female.   HPI  History obtained from the patient. Jaena presents for sore throat and painful swallowing that started on Tuesday. Noticed white pockets in the back of her throat. Has strep last month and reports taking her antibiotics. Denies fever, vomiting, abdominal pain, nausea, diarrhea or ear pain. Has not taken anything for her symptoms. 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      History reviewed. No pertinent past medical history.  There are no problems to display for this patient.   Past Surgical History:  Procedure Laterality Date   DENTAL SURGERY      OB History     Gravida  1   Para  1   Term  1   Preterm      AB      Living  1      SAB      IAB      Ectopic      Multiple  0   Live Births  1            Home Medications    Prior to Admission medications   Medication Sig Start Date End Date Taking? Authorizing Provider  cefdinir (OMNICEF) 300 MG capsule Take 1 capsule (300 mg total) by mouth 2 (two) times daily for 10 days. 04/02/23 04/12/23 Yes Jams Trickett, Seward Meth, DO  conjugated estrogens (PREMARIN) vaginal cream Use 2-3 times weekly vaginally . 04/01/22  Yes Hildred Laser, MD  diazepam (VALIUM) 5 MG tablet Apply every night vaginally for 1 month, then decrease to twice weekly. 04/01/22  Yes Hildred Laser, MD  Drospirenone (SLYND) 4 MG TABS Take 1 tablet by mouth daily. 06/06/22  Yes Hildred Laser, MD  ipratropium (ATROVENT) 0.06 % nasal spray Place 2 sprays into both nostrils 4 (four) times daily. 02/19/22  Yes Becky Augusta, NP  lidocaine (XYLOCAINE) 2 % solution Use as directed 15 mLs in the mouth or throat every 4 (four) hours as  needed for mouth pain. 04/02/23  Yes Katha Cabal, DO    Family History Family History  Problem Relation Age of Onset   Ovarian cancer Mother    Bipolar disorder Mother    Thyroid disease Mother    Healthy Father    Skin cancer Maternal Grandmother     Social History Social History   Tobacco Use   Smoking status: Never   Smokeless tobacco: Never  Vaping Use   Vaping status: Never Used  Substance Use Topics   Alcohol use: Not Currently   Drug use: Not Currently     Allergies   Patient has no known allergies.   Review of Systems Review of Systems: negative unless otherwise stated in HPI.      Physical Exam Triage Vital Signs ED Triage Vitals  Encounter Vitals Group     BP 04/02/23 1129 112/81     Systolic BP Percentile --      Diastolic BP Percentile --      Pulse Rate 04/02/23 1129 (!) 110     Resp 04/02/23 1129 18     Temp 04/02/23  1129 99.5 F (37.5 C)     Temp src --      SpO2 04/02/23 1129 99 %     Weight --      Height --      Head Circumference --      Peak Flow --      Pain Score 04/02/23 1130 5     Pain Loc --      Pain Education --      Exclude from Growth Chart --    No data found.  Updated Vital Signs BP 112/81   Pulse (!) 110   Temp 99.5 F (37.5 C)   Resp 18   SpO2 100%   Visual Acuity Right Eye Distance:   Left Eye Distance:   Bilateral Distance:    Right Eye Near:   Left Eye Near:    Bilateral Near:     Physical Exam GEN:     alert, well appearing female in no distress    HENT:  mucus membranes moist, oropharyngeal without lesions, +erythema, + tonsillar hypertrophy with exudates, no nasal discharge EYES:   pupils equal and reactive, no scleral injection or discharge NECK:  normal ROM, bilateral anterior lymphadenopathy, no meningismus   RESP:  no increased work of breathing Skin:   warm and dry, no rash on visible skin    UC Treatments / Results  Labs (all labs ordered are listed, but only abnormal results are  displayed) Labs Reviewed  GROUP A STREP BY PCR - Abnormal; Notable for the following components:      Result Value   Group A Strep by PCR DETECTED (*)    All other components within normal limits    EKG   Radiology No results found.  Procedures Procedures (including critical care time)  Medications Ordered in UC Medications - No data to display  Initial Impression / Assessment and Plan / UC Course  I have reviewed the triage vital signs and the nursing notes.  Pertinent labs & imaging results that were available during my care of the patient were reviewed by me and considered in my medical decision making (see chart for details).       Strep pharyngitis Patient is a 22 y.o. female who presents for sore throat for the past 2-3 days.  On exam, pharyngeal exam is erythematous with enlarged tonsils with exudates.  Strep test is positive. Overall patient is non-toxic-appearing, well-hydrated and without respiratory distress. She has an elevated temperature here (99.5 F). Tylenol/Motrin as needed for discomfort.  Continue gargling with warm salt water.  Recommended to avoid anything that irritates herthroat.  Offered Bicillin but she declined. Treat with cefdinir for 10 days. Lidocaine for throat pain.  Stressed the importance of hydration.  Work note provided.  Discussed MDM, treatment plan and plan for follow-up with patient who agrees with plan.      Final Clinical Impressions(s) / UC Diagnoses   Final diagnoses:  Strep pharyngitis     Discharge Instructions      Stop by the pharmacy to pick up your prescriptions. Gargle with salt water and drink warm substances to help soothe the throat. Popsicles can help too.      ED Prescriptions     Medication Sig Dispense Auth. Provider   cefdinir (OMNICEF) 300 MG capsule Take 1 capsule (300 mg total) by mouth 2 (two) times daily for 10 days. 20 capsule Mico Spark, DO   lidocaine (XYLOCAINE) 2 % solution Use as directed  15 mLs  in the mouth or throat every 4 (four) hours as needed for mouth pain. 100 mL Katha Cabal, DO      PDMP not reviewed this encounter.   Katha Cabal, DO 04/02/23 1213

## 2023-04-02 NOTE — Discharge Instructions (Addendum)
Stop by the pharmacy to pick up your prescriptions. Gargle with salt water and drink warm substances to help soothe the throat. Popsicles can help too.

## 2023-04-07 NOTE — Progress Notes (Unsigned)
GYNECOLOGY ANNUAL PHYSICAL EXAM PROGRESS NOTE  Subjective:    Cynthia Schmitt is a 22 y.o. G25P1001 female who presents for an annual exam. The patient has no complaints today. The patient {is/is not/has never been:13135} sexually active. The patient participates in regular exercise: {yes/no/not asked:9010}. Has the patient ever been transfused or tattooed?: {yes/no/not asked:9010}. The patient reports that there {is/is not:9024} domestic violence in her life.    Menstrual History: Menarche age: 84 No LMP recorded. (Menstrual status: Oral contraceptives).     Gynecologic History:  Contraception: OCP (estrogen/progesterone) History of STI's: Denies Last Pap: 07/09/21. Results were: normal. Denies h/o abnormal pap smears. Last mammogram:  Not age appropriate      OB History  Gravida Para Term Preterm AB Living  1 1 1  0 0 1  SAB IAB Ectopic Multiple Live Births  0 0 0 0 1    # Outcome Date GA Lbr Len/2nd Weight Sex Type Anes PTL Lv  1 Term 05/28/21 [redacted]w[redacted]d / 01:26 8 lb 6 oz (3.8 kg) M Vag-Spont EPI  LIV     Name: Blades,BOY Jazalyn     Apgar1: 6  Apgar5: 6    No past medical history on file.  Past Surgical History:  Procedure Laterality Date   DENTAL SURGERY      Family History  Problem Relation Age of Onset   Ovarian cancer Mother    Bipolar disorder Mother    Thyroid disease Mother    Healthy Father    Skin cancer Maternal Grandmother     Social History   Socioeconomic History   Marital status: Married    Spouse name: [name]   Number of children: Not on file   Years of education: Not on file   Highest education level: Not on file  Occupational History   Not on file  Tobacco Use   Smoking status: Never   Smokeless tobacco: Never  Vaping Use   Vaping status: Never Used  Substance and Sexual Activity   Alcohol use: Not Currently   Drug use: Not Currently   Sexual activity: Not Currently    Partners: Male    Birth control/protection: Pill  Other Topics  Concern   Not on file  Social History Narrative   Not on file   Social Determinants of Health   Financial Resource Strain: Not on file  Food Insecurity: Not on file  Transportation Needs: Not on file  Physical Activity: Not on file  Stress: Not on file  Social Connections: Not on file  Intimate Partner Violence: Not on file    Current Outpatient Medications on File Prior to Visit  Medication Sig Dispense Refill   cefdinir (OMNICEF) 300 MG capsule Take 1 capsule (300 mg total) by mouth 2 (two) times daily for 10 days. 20 capsule 0   conjugated estrogens (PREMARIN) vaginal cream Use 2-3 times weekly vaginally . 42.5 g 3   diazepam (VALIUM) 5 MG tablet Apply every night vaginally for 1 month, then decrease to twice weekly. 30 tablet 3   Drospirenone (SLYND) 4 MG TABS Take 1 tablet by mouth daily. 84 tablet 3   ipratropium (ATROVENT) 0.06 % nasal spray Place 2 sprays into both nostrils 4 (four) times daily. 15 mL 12   lidocaine (XYLOCAINE) 2 % solution Use as directed 15 mLs in the mouth or throat every 4 (four) hours as needed for mouth pain. 100 mL 0   No current facility-administered medications on file prior to visit.  No Known Allergies   Review of Systems Constitutional: negative for chills, fatigue, fevers and sweats Eyes: negative for irritation, redness and visual disturbance Ears, nose, mouth, throat, and face: negative for hearing loss, nasal congestion, snoring and tinnitus Respiratory: negative for asthma, cough, sputum Cardiovascular: negative for chest pain, dyspnea, exertional chest pressure/discomfort, irregular heart beat, palpitations and syncope Gastrointestinal: negative for abdominal pain, change in bowel habits, nausea and vomiting Genitourinary: negative for abnormal menstrual periods, genital lesions, sexual problems and vaginal discharge, dysuria and urinary incontinence Integument/breast: negative for breast lump, breast tenderness and nipple  discharge Hematologic/lymphatic: negative for bleeding and easy bruising Musculoskeletal:negative for back pain and muscle weakness Neurological: negative for dizziness, headaches, vertigo and weakness Endocrine: negative for diabetic symptoms including polydipsia, polyuria and skin dryness Allergic/Immunologic: negative for hay fever and urticaria      Objective:  not currently breastfeeding. There is no height or weight on file to calculate BMI.    General Appearance:    Alert, cooperative, no distress, appears stated age  Head:    Normocephalic, without obvious abnormality, atraumatic  Eyes:    PERRL, conjunctiva/corneas clear, EOM's intact, both eyes  Ears:    Normal external ear canals, both ears  Nose:   Nares normal, septum midline, mucosa normal, no drainage or sinus tenderness  Throat:   Lips, mucosa, and tongue normal; teeth and gums normal  Neck:   Supple, symmetrical, trachea midline, no adenopathy; thyroid: no enlargement/tenderness/nodules; no carotid bruit or JVD  Back:     Symmetric, no curvature, ROM normal, no CVA tenderness  Lungs:     Clear to auscultation bilaterally, respirations unlabored  Chest Wall:    No tenderness or deformity   Heart:    Regular rate and rhythm, S1 and S2 normal, no murmur, rub or gallop  Breast Exam:    No tenderness, masses, or nipple abnormality  Abdomen:     Soft, non-tender, bowel sounds active all four quadrants, no masses, no organomegaly.    Genitalia:    Pelvic:external genitalia normal, vagina without lesions, discharge, or tenderness, rectovaginal septum  normal. Cervix normal in appearance, no cervical motion tenderness, no adnexal masses or tenderness.  Uterus normal size, shape, mobile, regular contours, nontender.  Rectal:    Normal external sphincter.  No hemorrhoids appreciated. Internal exam not done.   Extremities:   Extremities normal, atraumatic, no cyanosis or edema  Pulses:   2+ and symmetric all extremities  Skin:    Skin color, texture, turgor normal, no rashes or lesions  Lymph nodes:   Cervical, supraclavicular, and axillary nodes normal  Neurologic:   CNII-XII intact, normal strength, sensation and reflexes throughout   .  Labs:  Lab Results  Component Value Date   WBC 7.1 04/01/2022   HGB 13.1 04/01/2022   HCT 39.8 04/01/2022   MCV 87 04/01/2022   PLT 196 04/01/2022    Lab Results  Component Value Date   CREATININE 0.79 04/01/2022   BUN 10 04/01/2022   NA 141 04/01/2022   K 4.1 04/01/2022   CL 104 04/01/2022   CO2 21 04/01/2022    Lab Results  Component Value Date   ALT 16 04/01/2022   AST 16 04/01/2022   ALKPHOS 100 04/01/2022   BILITOT 0.9 04/01/2022    No results found for: "TSH"   Assessment:   1. Well woman exam with routine gynecological exam      Plan:  Blood tests: CBC with diff and Comprehensive metabolic panel. Breast self  exam technique reviewed and patient encouraged to perform self-exam monthly. Contraception: OCP (estrogen/progesterone). Discussed healthy lifestyle modifications. Mammogram  : Not age appropriate Pap smear  UTD . COVID vaccination status: Follow up in 1 year for annual exam   Hildred Laser, MD  [facility]

## 2023-04-07 NOTE — Patient Instructions (Signed)
Preventive Care 21-23 Years Old, Female Preventive care refers to lifestyle choices and visits with your health care provider that can promote health and wellness. Preventive care visits are also called wellness exams. What can I expect for my preventive care visit? Counseling During your preventive care visit, your health care provider may ask about your: Medical history, including: Past medical problems. Family medical history. Pregnancy history. Current health, including: Menstrual cycle. Method of birth control. Emotional well-being. Home life and relationship well-being. Sexual activity and sexual health. Lifestyle, including: Alcohol, nicotine or tobacco, and drug use. Access to firearms. Diet, exercise, and sleep habits. Work and work environment. Sunscreen use. Safety issues such as seatbelt and bike helmet use. Physical exam Your health care provider may check your: Height and weight. These may be used to calculate your BMI (body mass index). BMI is a measurement that tells if you are at a healthy weight. Waist circumference. This measures the distance around your waistline. This measurement also tells if you are at a healthy weight and may help predict your risk of certain diseases, such as type 2 diabetes and high blood pressure. Heart rate and blood pressure. Body temperature. Skin for abnormal spots. What immunizations do I need?  Vaccines are usually given at various ages, according to a schedule. Your health care provider will recommend vaccines for you based on your age, medical history, and lifestyle or other factors, such as travel or where you work. What tests do I need? Screening Your health care provider may recommend screening tests for certain conditions. This may include: Pelvic exam and Pap test. Lipid and cholesterol levels. Diabetes screening. This is done by checking your blood sugar (glucose) after you have not eaten for a while (fasting). Hepatitis  B test. Hepatitis C test. HIV (human immunodeficiency virus) test. STI (sexually transmitted infection) testing, if you are at risk. BRCA-related cancer screening. This may be done if you have a family history of breast, ovarian, tubal, or peritoneal cancers. Talk with your health care provider about your test results, treatment options, and if necessary, the need for more tests. Follow these instructions at home: Eating and drinking  Eat a healthy diet that includes fresh fruits and vegetables, whole grains, lean protein, and low-fat dairy products. Take vitamin and mineral supplements as recommended by your health care provider. Do not drink alcohol if: Your health care provider tells you not to drink. You are pregnant, may be pregnant, or are planning to become pregnant. If you drink alcohol: Limit how much you have to 0-1 drink a day. Know how much alcohol is in your drink. In the [place] one drink equals one 12 oz bottle of beer (355 mL), one 5 oz glass of wine (148 mL), or one 1 oz glass of hard liquor (44 mL). Lifestyle Brush your teeth every morning and night with fluoride toothpaste. Floss one time each day. Exercise for at least 30 minutes 5 or more days each week. Do not use any products that contain nicotine or tobacco. These products include cigarettes, chewing tobacco, and vaping devices, such as e-cigarettes. If you need help quitting, ask your health care provider. Do not use drugs. If you are sexually active, practice safe sex. Use a condom or other form of protection to prevent STIs. If you do not wish to become pregnant, use a form of birth control. If you plan to become pregnant, see your health care provider for a prepregnancy visit. Find healthy ways to manage stress, such as: Meditation,   yoga, or listening to music. Journaling. Talking to a trusted person. Spending time with friends and family. Minimize exposure to UV radiation to reduce your risk of skin  cancer. Safety Always wear your seat belt while driving or riding in a vehicle. Do not drive: If you have been drinking alcohol. Do not ride with someone who has been drinking. If you have been using any mind-altering substances or drugs. While texting. When you are tired or distracted. Wear a helmet and other protective equipment during sports activities. If you have firearms in your house, make sure you follow all gun safety procedures. Seek help if you have been physically or sexually abused. What's next? Go to your health care provider once a year for an annual wellness visit. Ask your health care provider how often you should have your eyes and teeth checked. Stay up to date on all vaccines. This information is not intended to replace advice given to you by your health care provider. Make sure you discuss any questions you have with your health care provider. Document Revised: 01/30/2021 Document Reviewed: 01/30/2021 Elsevier Patient Education  2024 [employer] Breast Self-Awareness Breast self-awareness is knowing how your breasts look and feel. You need to: Check your breasts on a regular basis. Tell your doctor about any changes. Become familiar with the look and feel of your breasts. This can help you catch a breast problem while it is still small and can be treated. You should do breast self-exams even if you have breast implants. What you need: A mirror. A well-lit room. A pillow or other soft object. How to do a breast self-exam Follow these steps to do a breast self-exam: Look for changes  Take off all the clothes above your waist. Stand in front of a mirror in a room with good lighting. Put your hands down at your sides. Compare your breasts in the mirror. Look for any difference between them, such as: A difference in shape. A difference in size. Wrinkles, dips, and bumps in one breast and not the other. Look at each breast for changes in the skin, such  as: Redness. Scaly areas. Skin that has gotten thicker. Dimpling. Open sores (ulcers). Look for changes in your nipples, such as: Fluid coming out of a nipple. Fluid around a nipple. Bleeding. Dimpling. Redness. A nipple that looks pushed in (retracted), or that has changed position. Feel for changes Lie on your back. Feel each breast. To do this: Pick a breast to feel. Place a pillow under the shoulder closest to that breast. Put the arm closest to that breast behind your head. Feel the nipple area of that breast using the hand of your other arm. Feel the area with the pads of your three middle fingers by making small circles with your fingers. Use light, medium, and firm pressure. Continue the overlapping circles, moving downward over the breast. Keep making circles with your fingers. Stop when you feel your ribs. Start making circles with your fingers again, this time going upward until you reach your collarbone. Then, make circles outward across your breast and into your armpit area. Squeeze your nipple. Check for discharge and lumps. Repeat these steps to check your other breast. Sit or stand in the tub or shower. With soapy water on your skin, feel each breast the same way you did when you were lying down. Write down what you find Writing down what you find can help you remember what to tell your doctor. Write down: What is   normal for each breast. Any changes you find in each breast. These include: The kind of changes you find. A tender or painful breast. Any lump you find. Write down its size and where it is. When you last had your monthly period (menstrual cycle). General tips If you are breastfeeding, the best time to check your breasts is after you feed your baby or after you use a breast pump. If you get monthly bleeding, the best time to check your breasts is 5-7 days after your monthly cycle ends. With time, you will become comfortable with the self-exam. You will  also start to know if there are changes in your breasts. Contact a doctor if: You see a change in the shape or size of your breasts or nipples. You see a change in the skin of your breast or nipples, such as red or scaly skin. You have fluid coming from your nipples that is not normal. You find a new lump or thick area. You have breast pain. You have any concerns about your breast health. Summary Breast self-awareness includes looking for changes in your breasts and feeling for changes within your breasts. You should do breast self-awareness in front of a mirror in a well-lit room. If you get monthly periods (menstrual cycles), the best time to check your breasts is 5-7 days after your period ends. Tell your doctor about any changes you see in your breasts. Changes include changes in size, changes on the skin, painful or tender breasts, or fluid from your nipples that is not normal. This information is not intended to replace advice given to you by your health care provider. Make sure you discuss any questions you have with your health care provider. Document Revised: 01/09/2022 Document Reviewed: 06/06/2021 Elsevier Patient Education  2024 [employer] Inc.  

## 2023-04-08 ENCOUNTER — Other Ambulatory Visit (HOSPITAL_COMMUNITY)
Admission: RE | Admit: 2023-04-08 | Discharge: 2023-04-08 | Disposition: A | Discharge status: Home / Self Care | Payer: Medicaid Other | Source: Ambulatory Visit | Attending: Obstetrics and Gynecology | Admitting: Obstetrics and Gynecology

## 2023-04-08 ENCOUNTER — Other Ambulatory Visit: Payer: Self-pay

## 2023-04-08 ENCOUNTER — Encounter: Payer: Self-pay | Admitting: Obstetrics and Gynecology

## 2023-04-08 ENCOUNTER — Ambulatory Visit (INDEPENDENT_AMBULATORY_CARE_PROVIDER_SITE_OTHER): Payer: Medicaid Other | Admitting: Obstetrics and Gynecology

## 2023-04-08 VITALS — BP 110/71 | HR 79 | Resp 16 | Ht 66.0 in | Wt 123.0 lb

## 2023-04-08 DIAGNOSIS — Z113 Encounter for screening for infections with a predominantly sexual mode of transmission: Secondary | ICD-10-CM | POA: Diagnosis present

## 2023-04-08 DIAGNOSIS — Z30019 Encounter for initial prescription of contraceptives, unspecified: Secondary | ICD-10-CM

## 2023-04-08 DIAGNOSIS — Z01419 Encounter for gynecological examination (general) (routine) without abnormal findings: Secondary | ICD-10-CM

## 2023-04-08 DIAGNOSIS — M6289 Other specified disorders of muscle: Secondary | ICD-10-CM

## 2023-04-08 DIAGNOSIS — N941 Unspecified dyspareunia: Secondary | ICD-10-CM

## 2023-04-08 MED ORDER — SLYND 4 MG PO TABS
1.0000 | ORAL_TABLET | Freq: Every day | ORAL | 3 refills | Status: DC
Start: 2023-04-08 — End: 2024-05-09

## 2023-04-09 LAB — COMPREHENSIVE METABOLIC PANEL
ALT: 35 IU/L — ABNORMAL HIGH (ref 0–32)
AST: 20 IU/L (ref 0–40)
Albumin: 4.4 g/dL (ref 4.0–5.0)
Alkaline Phosphatase: 95 IU/L (ref 44–121)
BUN/Creatinine Ratio: 10 (ref 9–23)
BUN: 8 mg/dL (ref 6–20)
Bilirubin Total: 0.4 mg/dL (ref 0.0–1.2)
CO2: 21 mmol/L (ref 20–29)
Calcium: 9.1 mg/dL (ref 8.7–10.2)
Chloride: 106 mmol/L (ref 96–106)
Creatinine, Ser: 0.79 mg/dL (ref 0.57–1.00)
Globulin, Total: 2.4 g/dL (ref 1.5–4.5)
Glucose: 75 mg/dL (ref 70–99)
Potassium: 4.4 mmol/L (ref 3.5–5.2)
Sodium: 142 mmol/L (ref 134–144)
Total Protein: 6.8 g/dL (ref 6.0–8.5)
eGFR: 108 mL/min/{1.73_m2} (ref >59)

## 2023-04-09 LAB — CBC
Hematocrit: 40.9 % (ref 34.0–46.6)
Hemoglobin: 13.3 g/dL (ref 11.1–15.9)
MCH: 28.1 pg (ref 26.6–33.0)
MCHC: 32.5 g/dL (ref 31.5–35.7)
MCV: 86 fL (ref 79–97)
Platelets: 241 10*3/uL (ref 150–450)
RBC: 4.74 x10E6/uL (ref 3.77–5.28)
RDW: 12.5 % (ref 11.7–15.4)
WBC: 6 10*3/uL (ref 3.4–10.8)

## 2023-04-09 LAB — CERVICOVAGINAL ANCILLARY ONLY
Chlamydia: NEGATIVE
Comment: NEGATIVE
Comment: NORMAL
Neisseria Gonorrhea: NEGATIVE

## 2023-06-08 ENCOUNTER — Ambulatory Visit
Admission: EM | Admit: 2023-06-08 | Discharge: 2023-06-08 | Disposition: A | Discharge status: Home / Self Care | Payer: Medicaid Other | Attending: Physician Assistant | Admitting: Physician Assistant

## 2023-06-08 DIAGNOSIS — J020 Streptococcal pharyngitis: Secondary | ICD-10-CM | POA: Insufficient documentation

## 2023-06-08 LAB — GROUP A STREP BY PCR: Group A Strep by PCR: DETECTED — AB

## 2023-06-08 MED ORDER — AMOXICILLIN-POT CLAVULANATE 875-125 MG PO TABS: 1.0000 | ORAL_TABLET | Freq: Two times a day (BID) | ORAL | 0 refills | Status: AC

## 2023-06-08 NOTE — ED Provider Notes (Signed)
[facility]    CSN: [id number] Arrival date & time: 06/08/23  0810      History   Chief Complaint Chief Complaint  Patient presents with   Sore Throat    HPI Cynthia Schmitt is a 22 y.o. female presenting for sore throat and painful swallowing x 2 days.  Sore throat is 5 out of 10.  Patient denies fever, body aches, headaches, cough, congestion, chest pain, shortness of breath, vomiting or diarrhea.  Has taken ibuprofen and throat lozenges without relief.  Patient has had strep twice in the past 2 months. She says her current sore throat feels similar. She was treated with amoxicillin and prednisone in July and cefdinir in August.  HPI  History reviewed. No pertinent past medical history.  There are no problems to display for this patient.   Past Surgical History:  Procedure Laterality Date   DENTAL SURGERY      OB History     Gravida  1   Para  1   Term  1   Preterm      AB      Living  1      SAB      IAB      Ectopic      Multiple  0   Live Births  1            Home Medications    Prior to Admission medications   Medication Sig Start Date End Date Taking? Authorizing Provider  amoxicillin-clavulanate (AUGMENTIN) 875-125 MG tablet Take 1 tablet by mouth every 12 (twelve) hours for 10 days. 06/08/23 06/18/23 Yes Eusebio Friendly B, PA-C  conjugated estrogens (PREMARIN) vaginal cream Use 2-3 times weekly vaginally . 04/01/22  Yes Hildred Laser, MD  diazepam (VALIUM) 5 MG tablet Apply every night vaginally for 1 month, then decrease to twice weekly. 04/01/22  Yes Hildred Laser, MD  Drospirenone (SLYND) 4 MG TABS Take 1 tablet (4 mg total) by mouth daily. 04/08/23  Yes Hildred Laser, MD  ipratropium (ATROVENT) 0.06 % nasal spray Place 2 sprays into both nostrils 4 (four) times daily. 02/19/22   Becky Augusta, NP  lidocaine (XYLOCAINE) 2 % solution Use as directed 15 mLs in the mouth or throat every 4 (four) hours as needed for mouth pain. 04/02/23    Katha Cabal, DO    Family History Family History  Problem Relation Age of Onset   Ovarian cancer Mother    Bipolar disorder Mother    Thyroid disease Mother    Healthy Father    Skin cancer Maternal Grandmother     Social History Social History   Tobacco Use   Smoking status: Never   Smokeless tobacco: Never  Vaping Use   Vaping status: Never Used  Substance Use Topics   Alcohol use: Not Currently   Drug use: Not Currently     Allergies   Patient has no known allergies.   Review of Systems Review of Systems  Constitutional:  Positive for fatigue. Negative for chills, diaphoresis and fever.  HENT:  Positive for sore throat. Negative for congestion, ear pain, rhinorrhea, sinus pressure, sinus pain and trouble swallowing.   Respiratory:  Negative for cough and shortness of breath.   Cardiovascular:  Negative for chest pain.  Gastrointestinal:  Negative for abdominal pain, nausea and vomiting.  Musculoskeletal:  Negative for arthralgias and myalgias.  Skin:  Negative for rash.  Neurological:  Negative for weakness and headaches.  Hematological:  Positive for adenopathy.  Physical Exam Triage Vital Signs ED Triage Vitals  Encounter Vitals Group     BP      Systolic BP Percentile      Diastolic BP Percentile      Pulse      Resp      Temp      Temp src      SpO2      Weight      Height      Head Circumference      Peak Flow      Pain Score      Pain Loc      Pain Education      Exclude from Growth Chart    No data found.  Updated Vital Signs BP 115/73 (BP Location: Left Arm)   Pulse 65   Temp 98.5 F (36.9 C) (Oral)   Resp 16   Ht 5\' 6"  (1.676 m)   Wt 122 lb (55.3 kg)   SpO2 99%   BMI 19.69 kg/m     Physical Exam Vitals and nursing note reviewed.  Constitutional:      General: She is not in acute distress.    Appearance: Normal appearance. She is well-developed. She is not ill-appearing or toxic-appearing.  HENT:     Head:  Normocephalic and atraumatic.     Nose: Nose normal.     Mouth/Throat:     Mouth: Mucous membranes are moist.     Pharynx: Oropharynx is clear. Posterior oropharyngeal erythema present.     Tonsils: 2+ on the right. 2+ on the left.  Eyes:     General: No scleral icterus.       Right eye: No discharge.        Left eye: No discharge.     Conjunctiva/sclera: Conjunctivae normal.  Cardiovascular:     Rate and Rhythm: Normal rate and regular rhythm.     Heart sounds: Normal heart sounds.  Pulmonary:     Effort: Pulmonary effort is normal. No respiratory distress.     Breath sounds: Normal breath sounds.  Musculoskeletal:     Cervical back: Neck supple.  Skin:    General: Skin is dry.  Neurological:     General: No focal deficit present.     Mental Status: She is alert. Mental status is at baseline.     Motor: No weakness.     Gait: Gait normal.  Psychiatric:        Mood and Affect: Mood normal.        Behavior: Behavior normal.        Thought Content: Thought content normal.      UC Treatments / Results  Labs (all labs ordered are listed, but only abnormal results are displayed) Labs Reviewed  GROUP A STREP BY PCR - Abnormal; Notable for the following components:      Result Value   Group A Strep by PCR DETECTED (*)    All other components within normal limits    EKG   Radiology No results found.  Procedures Procedures (including critical care time)  Medications Ordered in UC Medications - No data to display  Initial Impression / Assessment and Plan / UC Course  I have reviewed the triage vital signs and the nursing notes.  Pertinent labs & imaging results that were available during my care of the patient were reviewed by me and considered in my medical decision making (see chart for details).   22 year old female presents for 2-day history  of sore throat, painful swallowing.  Denies fever, cough, congestion.  Has taken ibuprofen and throat lozenges without  relief.  History of strep twice in the past 3 months with the last episode 2 months ago.  Has taken amoxicillin and cefdinir previously.  Patient is overall well-appearing.  No acute distress.  On exam has erythema of posterior pharynx with 2+bilateral enlarged tonsils.  Remainder of HEENT exam normal.  Chest clear to auscultation.  Heart regular rate and rhythm.  Strep test obtained.  Positive.  Reviewed results with patient.  Treating acute strep pharyngitis with Augmentin.  Supportive care encouraged.  Reviewed return precautions.   Final Clinical Impressions(s) / UC Diagnoses   Final diagnoses:  Strep pharyngitis   Discharge Instructions   None    ED Prescriptions     Medication Sig Dispense Auth. Provider   amoxicillin-clavulanate (AUGMENTIN) 875-125 MG tablet Take 1 tablet by mouth every 12 (twelve) hours for 10 days. 20 tablet Gareth Morgan      PDMP not reviewed this encounter.   Shirlee Latch, PA-C 06/08/23 [phone]

## 2023-06-08 NOTE — ED Triage Notes (Signed)
Pt c/o sore throat x2 days. Denies any fevers. Has tried IBU & cough lozenges w/o relief.

## 2023-07-08 ENCOUNTER — Ambulatory Visit: Payer: Medicaid Other | Attending: Obstetrics and Gynecology

## 2023-07-08 ENCOUNTER — Other Ambulatory Visit: Payer: Self-pay

## 2023-07-08 DIAGNOSIS — R102 Pelvic and perineal pain: Secondary | ICD-10-CM | POA: Insufficient documentation

## 2023-07-08 DIAGNOSIS — N941 Unspecified dyspareunia: Secondary | ICD-10-CM | POA: Diagnosis not present

## 2023-07-08 DIAGNOSIS — M6281 Muscle weakness (generalized): Secondary | ICD-10-CM | POA: Insufficient documentation

## 2023-07-08 DIAGNOSIS — M6289 Other specified disorders of muscle: Secondary | ICD-10-CM | POA: Diagnosis not present

## 2023-07-08 DIAGNOSIS — R278 Other lack of coordination: Secondary | ICD-10-CM | POA: Insufficient documentation

## 2023-07-08 DIAGNOSIS — R293 Abnormal posture: Secondary | ICD-10-CM | POA: Diagnosis present
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
## 2023-07-08 NOTE — Patient Instructions (Signed)
TOILET POSTURE: Urination: feet flat, lean forward with forearms on legs to fully empty bladder. Bowel movement: place feet flat on Squatty Potty or stool so knees are higher than hips, lean forward to relax pelvic floor in order to avoid strain.  SHOES: wear supportive shoes, and sandals with straps.  POSTURE: try not to cross legs at knees or ankles. Try the figure four stretch instead. When sitting on the floor, place a blanket under hips for comfort.  WATER: start with water first thing in the morning.   PELVIC TILTS: try to stand in neutral, not tucking your "tail" and not arching back, but in the middle.

## 2023-07-13 ENCOUNTER — Ambulatory Visit: Payer: Medicaid Other

## 2023-07-13 ENCOUNTER — Other Ambulatory Visit: Payer: Self-pay

## 2023-07-13 DIAGNOSIS — R278 Other lack of coordination: Secondary | ICD-10-CM

## 2023-07-13 DIAGNOSIS — R102 Pelvic and perineal pain: Secondary | ICD-10-CM

## 2023-07-13 DIAGNOSIS — R293 Abnormal posture: Secondary | ICD-10-CM

## 2023-07-13 DIAGNOSIS — M6281 Muscle weakness (generalized): Secondary | ICD-10-CM
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
## 2023-07-22 ENCOUNTER — Ambulatory Visit: Payer: Medicaid Other | Attending: Obstetrics and Gynecology

## 2023-07-22 ENCOUNTER — Other Ambulatory Visit: Payer: Self-pay

## 2023-07-22 DIAGNOSIS — R293 Abnormal posture: Secondary | ICD-10-CM | POA: Insufficient documentation

## 2023-07-22 DIAGNOSIS — R102 Pelvic and perineal pain: Secondary | ICD-10-CM | POA: Diagnosis present

## 2023-07-22 DIAGNOSIS — M6281 Muscle weakness (generalized): Secondary | ICD-10-CM | POA: Insufficient documentation

## 2023-07-22 DIAGNOSIS — R278 Other lack of coordination: Secondary | ICD-10-CM | POA: Diagnosis present

## 2023-07-22 NOTE — Patient Instructions (Signed)
DILATORS:   Use daily, insert largest tolerable size. Wait for pain to decrease, then slowly move up and down, side to side, and in circles. Once that is not painful you can move it in and out. 5 minutes. Using lube.  Skin rolling: Constant contact of fingers with skin (from belly button to ribs, right and left side). Rolling to decr. Skin/fascia tension, a few minutes daily.
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
## 2023-07-29 ENCOUNTER — Other Ambulatory Visit: Payer: Self-pay

## 2023-07-29 ENCOUNTER — Ambulatory Visit: Payer: Medicaid Other

## 2023-07-29 DIAGNOSIS — R293 Abnormal posture: Secondary | ICD-10-CM

## 2023-07-29 DIAGNOSIS — R102 Pelvic and perineal pain: Secondary | ICD-10-CM

## 2023-07-29 DIAGNOSIS — R278 Other lack of coordination: Secondary | ICD-10-CM

## 2023-07-29 DIAGNOSIS — M6281 Muscle weakness (generalized): Secondary | ICD-10-CM
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
## 2023-08-05 ENCOUNTER — Ambulatory Visit: Payer: Medicaid Other

## 2023-08-05 ENCOUNTER — Other Ambulatory Visit: Payer: Self-pay

## 2023-08-05 DIAGNOSIS — R102 Pelvic and perineal pain: Secondary | ICD-10-CM

## 2023-08-05 DIAGNOSIS — M6281 Muscle weakness (generalized): Secondary | ICD-10-CM

## 2023-08-05 DIAGNOSIS — R278 Other lack of coordination: Secondary | ICD-10-CM

## 2023-08-05 DIAGNOSIS — R293 Abnormal posture: Secondary | ICD-10-CM
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
## 2023-08-17 ENCOUNTER — Other Ambulatory Visit: Payer: Self-pay

## 2023-08-17 ENCOUNTER — Ambulatory Visit: Payer: Medicaid Other

## 2023-08-17 DIAGNOSIS — R102 Pelvic and perineal pain: Secondary | ICD-10-CM | POA: Diagnosis not present

## 2023-08-17 DIAGNOSIS — R278 Other lack of coordination: Secondary | ICD-10-CM

## 2023-08-17 DIAGNOSIS — R293 Abnormal posture: Secondary | ICD-10-CM

## 2023-08-17 DIAGNOSIS — M6281 Muscle weakness (generalized): Secondary | ICD-10-CM
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
## 2023-08-19 NOTE — L&D Delivery Note (Signed)
         Delivery Note   Cynthia Schmitt is a 23 y.o. G2P1001 at [redacted]w[redacted]d Estimated Date of Delivery: 07/16/24  PRE-OPERATIVE DIAGNOSIS:  1) [redacted]w[redacted]d pregnancy.    POST-OPERATIVE DIAGNOSIS:  1) [redacted]w[redacted]d pregnancy s/p Vaginal, Spontaneous   Delivery Type: Vaginal, Spontaneous   Delivery Anesthesia:  None  Labor Complications:  none    ESTIMATED BLOOD LOSS: 500 ml    FINDINGS:   1) female infant, Apgar scores of 8   at 1 minute and 9   at 5 minutes and a birthweight is pending, infant skin to skin  2) Nuchal cord: yes, x1 . Reduced  SPECIMENS:   PLACENTA:   Appearance:  intact, 3 vessel cord. Cord blood sample collected   Removal: spontaneous    Disposition:  per protocol   DISPOSITION:  Infant to left in stable condition in the delivery room, with L&D personnel and mother,  NARRATIVE SUMMARY: Labor course:  Ms. Cynthia Schmitt is a G2P1001 at [redacted]w[redacted]d who presented for labor management.  She progressed well in labor without pitocin .  She received the no anesthesia and proceeded to complete dilation. She evidenced good maternal expulsive effort during the second stage. She went on to deliver a viable female infant. The placenta delivered without problems and was noted to be complete. A perineal and vaginal examination was performed. Episiotomy/Lacerations:  second degree vaginal/perineal  The Laceration was repaired with 3-0 Vicryl Rapide suture using local anesthesia. The patient tolerated this well.  Zelda Hummer, CNM  07/10/2024 2:48 AM

## 2023-08-25 ENCOUNTER — Other Ambulatory Visit: Payer: Self-pay

## 2023-08-25 ENCOUNTER — Ambulatory Visit: Payer: Medicaid Other | Attending: Obstetrics and Gynecology

## 2023-08-25 DIAGNOSIS — M6281 Muscle weakness (generalized): Secondary | ICD-10-CM | POA: Insufficient documentation

## 2023-08-25 DIAGNOSIS — R278 Other lack of coordination: Secondary | ICD-10-CM | POA: Diagnosis present

## 2023-08-25 DIAGNOSIS — R102 Pelvic and perineal pain: Secondary | ICD-10-CM | POA: Insufficient documentation

## 2023-08-25 DIAGNOSIS — R293 Abnormal posture: Secondary | ICD-10-CM | POA: Insufficient documentation
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
## 2023-08-26 ENCOUNTER — Ambulatory Visit: Payer: Medicaid Other

## 2023-09-01 ENCOUNTER — Other Ambulatory Visit: Payer: Self-pay

## 2023-09-01 ENCOUNTER — Ambulatory Visit: Payer: Medicaid Other

## 2023-09-01 DIAGNOSIS — R278 Other lack of coordination: Secondary | ICD-10-CM

## 2023-09-01 DIAGNOSIS — R293 Abnormal posture: Secondary | ICD-10-CM

## 2023-09-01 DIAGNOSIS — M6281 Muscle weakness (generalized): Secondary | ICD-10-CM

## 2023-09-01 DIAGNOSIS — R102 Pelvic and perineal pain: Secondary | ICD-10-CM
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
## 2023-09-01 NOTE — Patient Instructions (Signed)

## 2023-09-08 ENCOUNTER — Ambulatory Visit: Payer: Medicaid Other

## 2023-09-08 ENCOUNTER — Other Ambulatory Visit: Payer: Self-pay

## 2023-09-08 DIAGNOSIS — R102 Pelvic and perineal pain unspecified side: Secondary | ICD-10-CM

## 2023-09-08 DIAGNOSIS — R293 Abnormal posture: Secondary | ICD-10-CM

## 2023-09-08 DIAGNOSIS — R278 Other lack of coordination: Secondary | ICD-10-CM

## 2023-09-08 DIAGNOSIS — M6281 Muscle weakness (generalized): Secondary | ICD-10-CM

## 2023-09-08 NOTE — Patient Instructions (Signed)
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
## 2023-10-13 ENCOUNTER — Other Ambulatory Visit: Payer: Self-pay

## 2023-10-13 ENCOUNTER — Ambulatory Visit: Payer: Medicaid Other | Attending: Obstetrics and Gynecology

## 2023-10-13 DIAGNOSIS — R102 Pelvic and perineal pain: Secondary | ICD-10-CM | POA: Insufficient documentation

## 2023-10-13 DIAGNOSIS — M6281 Muscle weakness (generalized): Secondary | ICD-10-CM | POA: Diagnosis present

## 2023-10-13 DIAGNOSIS — R278 Other lack of coordination: Secondary | ICD-10-CM | POA: Insufficient documentation

## 2023-10-13 DIAGNOSIS — R293 Abnormal posture: Secondary | ICD-10-CM | POA: Diagnosis present
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
## 2023-11-03 ENCOUNTER — Ambulatory Visit: Payer: Medicaid Other | Attending: Obstetrics and Gynecology

## 2023-11-03 ENCOUNTER — Other Ambulatory Visit: Payer: Self-pay

## 2023-11-03 DIAGNOSIS — R278 Other lack of coordination: Secondary | ICD-10-CM | POA: Diagnosis present

## 2023-11-03 DIAGNOSIS — R293 Abnormal posture: Secondary | ICD-10-CM | POA: Diagnosis present

## 2023-11-03 DIAGNOSIS — M6281 Muscle weakness (generalized): Secondary | ICD-10-CM | POA: Insufficient documentation

## 2023-11-03 DIAGNOSIS — R102 Pelvic and perineal pain: Secondary | ICD-10-CM | POA: Insufficient documentation

## 2023-11-03 NOTE — Addendum Note (Signed)
 Addended by: Sherren Kerns on: 11/03/2023 10:48 AM   Modules accepted: Orders
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
## 2023-11-04 ENCOUNTER — Encounter: Payer: Self-pay | Admitting: Obstetrics and Gynecology

## 2023-11-04 ENCOUNTER — Ambulatory Visit

## 2023-11-04 VITALS — BP 117/72 | HR 109 | Ht 66.0 in | Wt 133.6 lb

## 2023-11-04 DIAGNOSIS — Z3201 Encounter for pregnancy test, result positive: Secondary | ICD-10-CM

## 2023-11-04 DIAGNOSIS — N912 Amenorrhea, unspecified: Secondary | ICD-10-CM

## 2023-11-04 LAB — POCT URINE PREGNANCY: Preg Test, Ur: POSITIVE — AB

## 2023-11-04 NOTE — Patient Instructions (Signed)

## 2023-11-04 NOTE — Progress Notes (Signed)
    NURSE VISIT NOTE  Subjective:    Patient ID: Cynthia Schmitt, female    DOB: Apr 26, 2001, 23 y.o.   MRN: [id number]  HPI  Patient is a 23 y.o. G60P1001 female who presents for evaluation of amenorrhea. She believes she could be pregnant. Pregnancy is desired. Sexual Activity: single partner, contraception: none. Current symptoms also include: frequent urination and positive home pregnancy test. Last period was normal.    Objective:    BP 117/72   Pulse (!) 109   Ht 5\' 6"  (1.676 m)   Wt 133 lb 9.6 oz (60.6 kg)   LMP 10/05/2023   BMI 21.56 kg/m   Lab Review  Results for orders placed or performed in visit on 11/04/23  POCT urine pregnancy  Result Value Ref Range   Preg Test, Ur Positive (A) Negative    Assessment:   1. Absence of menstruation     Plan:   Pregnancy Test: Positive  Estimated Date of Delivery: 07/11/24 BP Cuff Measurement taken. Cuff Size Adult Small Encouraged well-balanced diet, plenty of rest when needed, pre-natal vitamins daily and walking for exercise.  Discussed self-help for nausea, avoiding OTC medications until consulting provider or pharmacist, other than Tylenol as needed, minimal caffeine (1-2 cups daily) and avoiding alcohol.   She will schedule her nurse visit @ 7-[redacted] wks pregnant, u/s for dating @10  wk, and NOB visit at [redacted] wk pregnant.    Feel free to call with any questions.  Fonda Kinder, CMA

## 2023-11-05 ENCOUNTER — Telehealth: Payer: Self-pay

## 2023-11-05 NOTE — Telephone Encounter (Signed)
 Reached out to pt to reschedule future appts based upon her LMD date of 10/05/2023.  Left message for pt to call back when the phones turn back on at 1:00.

## 2023-11-05 NOTE — Telephone Encounter (Signed)
 Patient was seen in office yesterday 11/04/23 for pregnancy confirmation. Patient sent mychart message saying LMP date was wrong. Based of LMP patient is 4wks and 3days today.LMP is 10/05/23 with a EDD of 07/11/24. If you would please reach back out to patient and reschedule NOB intake and Initial prenatal visit.   Thank you. KW

## 2023-11-16 ENCOUNTER — Telehealth

## 2023-11-27 ENCOUNTER — Telehealth

## 2023-11-27 DIAGNOSIS — Z3689 Encounter for other specified antenatal screening: Secondary | ICD-10-CM

## 2023-11-27 DIAGNOSIS — Z3480 Encounter for supervision of other normal pregnancy, unspecified trimester: Secondary | ICD-10-CM | POA: Insufficient documentation

## 2023-11-27 DIAGNOSIS — Z3687 Encounter for antenatal screening for uncertain dates: Secondary | ICD-10-CM

## 2023-11-27 NOTE — Progress Notes (Signed)
 New OB Intake  I connected with  Cynthia Schmitt on 11/27/23 at  1:15 PM EDT by MyChart Video Visit and verified that I am speaking with the correct person using two identifiers. Nurse is located at [facility] and pt is located at work.  I discussed the limitations, risks, security and privacy concerns of performing an evaluation and management service by telephone and the availability of in person appointments. I also discussed with the patient that there may be a patient responsible charge related to this service. The patient expressed understanding and agreed to proceed.  I explained I am completing New OB Intake today. We discussed her EDD of 06/10/2024 that is based on LMP of 10/05/2023. Pt is G2/P1. I reviewed her allergies, medications, Medical/Surgical/OB history, and appropriate screenings. There are cats in the home  yes Indoor. Based on history, this is a/an pregnancy uncomplicated . Her obstetrical history is significant for  N/A . Patietn did want to notate husband sister had a hole in heart as a baby. Patient Active Problem List   Diagnosis Date Noted   Supervision of other normal pregnancy, antepartum 11/27/2023    Concerns addressed today: None   Delivery Plans:  Plans to [facility] at [facility].  Anatomy US Explained first scheduled [place] will be around 19 weeks Labs Discussed genetic screening with patient. Patient Mat21 genetic testing to be drawn at new OB visit. Discussed possible labs to be drawn at new OB appointment.  COVID Vaccine Patient has not had COVID vaccine.   Social Determinants of Health Food Insecurity: denies food insecurity WIC Referral: Patient is not interested in referral to [facility].  Transportation: Patient denies transportation needs. Childcare: Discussed no children allowed at ultrasound appointments.   First visit review I reviewed new OB appt with pt. I explained she will have blood work and pap smear/pelvic exam if indicated.  Explained pt will be seen by Melissa.Swanson CNM at first visit; encounter routed to appropriate provider.   Loney Laurence, CMA 11/27/2023  1:40 PM

## 2023-11-30 ENCOUNTER — Encounter: Payer: Self-pay | Admitting: Obstetrics and Gynecology

## 2023-11-30 MED ORDER — ONDANSETRON 4 MG PO TBDP: 4.0000 mg | ORAL_TABLET | Freq: Three times a day (TID) | ORAL | 1 refills | Status: DC | PRN

## 2023-12-03 ENCOUNTER — Other Ambulatory Visit

## 2023-12-05 ENCOUNTER — Encounter: Payer: Self-pay | Admitting: Obstetrics and Gynecology

## 2023-12-05 DIAGNOSIS — O219 Vomiting of pregnancy, unspecified: Secondary | ICD-10-CM

## 2023-12-07 MED ORDER — ONDANSETRON 4 MG PO TBDP: 4.0000 mg | ORAL_TABLET | Freq: Three times a day (TID) | ORAL | 1 refills | Status: DC | PRN

## 2023-12-10 ENCOUNTER — Encounter: Admitting: Certified Nurse Midwife

## 2023-12-10 ENCOUNTER — Ambulatory Visit (INDEPENDENT_AMBULATORY_CARE_PROVIDER_SITE_OTHER)

## 2023-12-10 DIAGNOSIS — Z3A08 8 weeks gestation of pregnancy: Secondary | ICD-10-CM

## 2023-12-10 DIAGNOSIS — Z3687 Encounter for antenatal screening for uncertain dates: Secondary | ICD-10-CM

## 2023-12-10 DIAGNOSIS — Z3480 Encounter for supervision of other normal pregnancy, unspecified trimester: Secondary | ICD-10-CM

## 2023-12-10 DIAGNOSIS — Z3481 Encounter for supervision of other normal pregnancy, first trimester: Secondary | ICD-10-CM | POA: Diagnosis not present

## 2023-12-14 ENCOUNTER — Encounter: Payer: Self-pay | Admitting: Obstetrics and Gynecology

## 2023-12-16 ENCOUNTER — Other Ambulatory Visit: Payer: Self-pay | Admitting: Obstetrics

## 2023-12-16 ENCOUNTER — Encounter: Payer: Self-pay | Admitting: Obstetrics

## 2023-12-24 ENCOUNTER — Encounter: Admitting: Obstetrics

## 2024-01-01 ENCOUNTER — Encounter: Admitting: Certified Nurse Midwife

## 2024-01-04 ENCOUNTER — Other Ambulatory Visit (HOSPITAL_COMMUNITY)
Admission: RE | Admit: 2024-01-04 | Discharge: 2024-01-04 | Disposition: A | Discharge status: Home / Self Care | Source: Ambulatory Visit | Attending: Certified Nurse Midwife | Admitting: Certified Nurse Midwife

## 2024-01-04 ENCOUNTER — Encounter: Payer: Self-pay | Admitting: Certified Nurse Midwife

## 2024-01-04 ENCOUNTER — Ambulatory Visit (INDEPENDENT_AMBULATORY_CARE_PROVIDER_SITE_OTHER): Admitting: Certified Nurse Midwife

## 2024-01-04 VITALS — BP 99/58 | HR 99 | Wt 133.2 lb

## 2024-01-04 DIAGNOSIS — Z113 Encounter for screening for infections with a predominantly sexual mode of transmission: Secondary | ICD-10-CM

## 2024-01-04 DIAGNOSIS — Z0283 Encounter for blood-alcohol and blood-drug test: Secondary | ICD-10-CM

## 2024-01-04 DIAGNOSIS — Z130 Encounter for screening for diseases of the blood and blood-forming organs and certain disorders involving the immune mechanism: Secondary | ICD-10-CM

## 2024-01-04 DIAGNOSIS — Z3A12 12 weeks gestation of pregnancy: Secondary | ICD-10-CM | POA: Diagnosis not present

## 2024-01-04 DIAGNOSIS — Z3481 Encounter for supervision of other normal pregnancy, first trimester: Secondary | ICD-10-CM

## 2024-01-04 DIAGNOSIS — Z3480 Encounter for supervision of other normal pregnancy, unspecified trimester: Secondary | ICD-10-CM

## 2024-01-04 DIAGNOSIS — Z1379 Encounter for other screening for genetic and chromosomal anomalies: Secondary | ICD-10-CM

## 2024-01-04 NOTE — Patient Instructions (Signed)
 Prenatal Care Prenatal care is health care you get when pregnant. It helps you and your unborn baby stay as healthy as possible. Start prenatal care early in your pregnancy and continue to go to visits during your pregnancy. Prenatal care may be given by a midwife, a family practice doctor, a Publishing rights manager, physician assistant, or a childbirth and pregnancy doctor. What are the benefits of prenatal care? In prenatal care, your health care provider will get to know your medical history. You'll be checked for conditions that might affect you and your baby. Prenatal care will: Lower the risk for problems as your child grows. Lower certain risks for your baby, especially the risk that: Your child may be born early. Your child will have a low weight at birth. What can I expect at the first prenatal care visit? Your first visit will likely be the longest. You should ask to be seen as soon as you know you're pregnant. The first visit is a good time to talk about any questions or concerns. Make a list of questions to ask your provider at your visits. Medical history At your visit, you and your provider will talk about your medical history, including: Your family's medical history and the medical history of the baby's father. Any past pregnancies and long-term (chronic) health conditions. Any surgeries or procedures you have had. All medicines you're taking. Tell them if you're taking herbs or supplements too. Any tobacco, alcohol, or drug use. Other problems that may harm you and your baby. Tell them if: You need food or housing. You have been around chemicals or radiation. Your partner yells at you, hits you, or hurts you. Tests and screenings Your provider will: Do a physical exam, including a pelvic and breast exam. Do tests to check for: Urinary tract infection (UTI). Sexually transmitted infections (STIs). Low iron levels in your blood. This is called anemia. Blood type and certain  proteins on red blood cells called Rh antibodies. Infections and immunity to viruses, such as hepatitis B and rubella. HIV. Ask your provider if you need to be checked for genetic diseases. Tips about staying healthy Your provider will also give you information about how to keep yourself and your baby healthy, including: Nutrition, vitamins, and food safety. Physical activity. How to treat some problems, such as morning sickness. How to avoid infections and substances that may harm your baby. Caring for your teeth. Work and travel. Problems that require you to call your provider. How often will I have prenatal care visits? After your first prenatal care visit, you will have regular visits throughout your pregnancy. You may visit your provider as follows: Up to week 28 of pregnancy: once every 4 weeks. 28-36 weeks: once every 2 weeks. After 36 weeks: every week until delivery. Some people may have more visits. Others may have fewer. It all depends on your health and that of your baby. Keep all prenatal visits. This is one way for you and your baby to stay as healthy as possible. What happens during routine prenatal care visits? Your provider will: Check your weight and blood pressure. Check your baby's heart sounds. Ask questions about your diet, exercise, sleeping patterns, and whether you can feel the baby move. Ask about any pregnancy symptoms you're having and how you're dealing with them. Tell your provider if: You throw up or feel like you may throw up. You have discharge or you bleed from your vagina. You have trouble pooping (constipation). You have swelling, headaches, or trouble  seeing. You are very tired, or you feel sad and anxious all the time. You have discomfort, including back pain or pain in the pelvis. Tell you problems to watch for during your pregnancy, including signs of labor. Measure the height of your uterus in your belly. This is called fundal height. What  tests might I have during prenatal care visits? You may have blood, urine, and imaging tests. These may include: Urine tests to check for blood sugar, protein, or signs of infection. Genetic testing. Ultrasounds to check your baby's growth, development, and well-being. Your baby may also be checked for congenital conditions. Glucose tests to check for gestational diabetes. This is a form of diabetes that a person can get when pregnant. A test to check for group B strep (GBS) infection. What else can I expect during prenatal care visits? Your provider may give you some vaccines. Getting certain vaccines during pregnancy can protect your baby after birth. These may include: A flu shot. Tdap (tetanus, diphtheria, pertussis) vaccine. A COVID-19 vaccine. A RSV vaccine. Later in your pregnancy, your provider may talk to you about: Childbirth and childbirth classes. Breastfeeding and breastfeeding classes. Birth control after your baby is born. Where to find more information Office on Women's Health: TravelLesson.ca American Pregnancy Association: americanpregnancy.org March of Dimes: marchofdimes.org This information is not intended to replace advice given to you by your health care provider. Make sure you discuss any questions you have with your health care provider. Document Revised: 12/08/2022 Document Reviewed: 12/08/2022 Elsevier Patient Education  2024 [employer].

## 2024-01-04 NOTE — Progress Notes (Signed)
 NEW OB HISTORY AND PHYSICAL  SUBJECTIVE:       Cynthia Schmitt is a 23 y.o. G89P1001 female, Patient's last menstrual period was 10/05/2023., Estimated Date of Delivery: 07/16/24, [redacted]w[redacted]d, presents today for establishment of Prenatal Care. She has no unusual complaints.  Relationship: married  Living: with spouse , child ( son Kenith Payer), and mother in law Work: toddler Runner, broadcasting/film/video Exercise: none out side of work  Alcohol/drugs/smoke/vape:  denies use    Gynecologic History Patient's last menstrual period was 10/05/2023. Normal Contraception: none Last Pap: 07/09/21. Results were: normal  Obstetric History OB History  Gravida Para Term Preterm AB Living  2 1 1   1   SAB IAB Ectopic Multiple Live Births     0 1    # Outcome Date GA Lbr Len/2nd Weight Sex Type Anes PTL Lv  2 Current           1 Term 05/28/21 [redacted]w[redacted]d / 01:26 8 lb 6 oz (3.8 kg) M Vag-Spont EPI  LIV    No past medical history on file.  Past Surgical History:  Procedure Laterality Date   DENTAL SURGERY      Current Outpatient Medications on File Prior to Visit  Medication Sig Dispense Refill   ondansetron  (ZOFRAN -ODT) 4 MG disintegrating tablet Take 1 tablet (4 mg total) by mouth every 8 (eight) hours as needed for nausea or vomiting. 20 tablet 1   Prenatal Vit-Fe Fumarate-FA (PRENATAL PO) Take by mouth.     conjugated estrogens  (PREMARIN ) vaginal cream Use 2-3 times weekly vaginally . (Patient not taking: Reported on 01/04/2024) 42.5 g 3   diazepam  (VALIUM ) 5 MG tablet Apply every night vaginally for 1 month, then decrease to twice weekly. (Patient not taking: Reported on 01/04/2024) 30 tablet 3   Drospirenone  (SLYND ) 4 MG TABS Take 1 tablet (4 mg total) by mouth daily. (Patient not taking: Reported on 01/04/2024) 84 tablet 3   ipratropium (ATROVENT ) 0.06 % nasal spray Place 2 sprays into both nostrils 4 (four) times daily. (Patient not taking: Reported on 11/04/2023) 15 mL 12   lidocaine  (XYLOCAINE ) 2 % solution Use as directed  15 mLs in the mouth or throat every 4 (four) hours as needed for mouth pain. (Patient not taking: Reported on 07/08/2023) 100 mL 0   Multiple Vitamins-Minerals (MULTIVITAMIN GUMMIES WOMENS PO) Take 1 capsule by mouth daily. (Patient not taking: Reported on 11/27/2023)     No current facility-administered medications on file prior to visit.    No Known Allergies  Social History   Socioeconomic History   Marital status: Married    Spouse name: Nikolas   Number of children: 1   Years of education: Not on file   Highest education level: Associate degree: academic program  Occupational History   Occupation: Runner, broadcasting/film/video  Tobacco Use   Smoking status: Never   Smokeless tobacco: Never  Vaping Use   Vaping status: Never Used  Substance and Sexual Activity   Alcohol use: Not Currently   Drug use: Not Currently   Sexual activity: Yes    Partners: Male  Other Topics Concern   Not on file  Social History Narrative   Pt is married, Neligh.    Bellamy-son   Social Drivers of Health   Financial Resource Strain: Low Risk  (11/27/2023)   Overall Financial Resource Strain (CARDIA)    Difficulty of Paying Living Expenses: Not hard at all  Food Insecurity: Food Insecurity Present (11/27/2023)   Hunger Vital Sign    Worried About  Running Out of Food in the Last Year: Sometimes true    Ran Out of Food in the Last Year: Never true  Transportation Needs: No Transportation Needs (11/27/2023)   PRAPARE - Administrator, Civil Service (Medical): No    Lack of Transportation (Non-Medical): No  Physical Activity: Sufficiently Active (11/27/2023)   Exercise Vital Sign    Days of Exercise per Week: 5 days    Minutes of Exercise per Session: 50 min  Stress: Stress Concern Present (11/27/2023)   [employer] of Occupational Health - Occupational Stress Questionnaire    Feeling of Stress : To some extent  Social Connections: Moderately Isolated (11/27/2023)   Social Connection and Isolation  Panel [NHANES]    Frequency of Communication with Friends and Family: Three times a week    Frequency of Social Gatherings with Friends and Family: Once a week    Attends Religious Services: Never    Database administrator or Organizations: No    Attends Banker Meetings: Never    Marital Status: Married  Catering manager Violence: Not At Risk (11/27/2023)   Humiliation, Afraid, Rape, and Kick questionnaire    Fear of Current or Ex-Partner: No    Emotionally Abused: No    Physically Abused: No    Sexually Abused: No    Family History  Problem Relation Age of Onset   Ovarian cancer Mother    Bipolar disorder Mother    Thyroid disease Mother    Healthy Father    Skin cancer Maternal Grandmother     The following portions of the patient's history were reviewed and updated as appropriate: allergies, current medications, past OB history, past medical history, past surgical history, past family history, past social history, and problem list.    OBJECTIVE: Initial Physical Exam (New OB)  GENERAL APPEARANCE: alert, well appearing, in no apparent distress, oriented to person, place and time HEAD: normocephalic, atraumatic MOUTH: mucous membranes moist, pharynx normal without lesions THYROID: no thyromegaly or masses present BREASTS: declines exam  LUNGS: clear to auscultation, no wheezes, rales or rhonchi, symmetric air entry HEART: regular rate and rhythm, no murmurs ABDOMEN: soft, nontender, nondistended, no abnormal masses, no epigastric pain EXTREMITIES: no redness or tenderness in the calves or thighs, no edema, no limitation in range of motion, intact peripheral pulses SKIN: normal coloration and turgor, no rashes LYMPH NODES: no adenopathy palpable NEUROLOGIC: alert, oriented, normal speech, no focal findings or movement disorder noted  PELVIC EXAM declines exam. Will do next pap postpartum   ASSESSMENT: Normal pregnancy  PLAN: Prenatal care See ordersNew  OB counseling: The patient has been given an overview regarding routine prenatal care. Recommendations regarding diet, weight gain, and exercise in pregnancy were given. Prenatal testing, optional genetic testing, carrier screening, and ultrasound use in pregnancy were reviewed.  Benefits of Breast Feeding were discussed. The patient is encouraged to consider nursing her baby post partum. Maternit 21 today.   Alise Appl, CNM

## 2024-01-05 LAB — CBC/D/PLT+RPR+RH+ABO+RUBIGG...
Antibody Screen: NEGATIVE
Basophils Absolute: 0 10*3/uL (ref 0.0–0.2)
Basos: 0 %
EOS (ABSOLUTE): 0.1 10*3/uL (ref 0.0–0.4)
Eos: 1 %
HCV Ab: NONREACTIVE
HIV Screen 4th Generation wRfx: NONREACTIVE
Hematocrit: 40.2 % (ref 34.0–46.6)
Hemoglobin: 13 g/dL (ref 11.1–15.9)
Hepatitis B Surface Ag: NEGATIVE
Immature Grans (Abs): 0 10*3/uL (ref 0.0–0.1)
Immature Granulocytes: 0 %
Lymphocytes Absolute: 1.8 10*3/uL (ref 0.7–3.1)
Lymphs: 32 %
MCH: 29.5 pg (ref 26.6–33.0)
MCHC: 32.3 g/dL (ref 31.5–35.7)
MCV: 91 fL (ref 79–97)
Monocytes Absolute: 0.5 10*3/uL (ref 0.1–0.9)
Monocytes: 8 %
Neutrophils Absolute: 3.3 10*3/uL (ref 1.4–7.0)
Neutrophils: 59 %
Platelets: 193 10*3/uL (ref 150–450)
RBC: 4.4 x10E6/uL (ref 3.77–5.28)
RDW: 12.5 % (ref 11.7–15.4)
RPR Ser Ql: NONREACTIVE
Rh Factor: NEGATIVE
Rubella Antibodies, IGG: 1.47 {index} (ref >0.99)
Varicella zoster IgG: NONREACTIVE
WBC: 5.7 10*3/uL (ref 3.4–10.8)

## 2024-01-05 LAB — URINALYSIS, ROUTINE W REFLEX MICROSCOPIC
Bilirubin, UA: NEGATIVE
Glucose, UA: NEGATIVE
Ketones, UA: NEGATIVE
Nitrite, UA: NEGATIVE
Protein,UA: NEGATIVE
RBC, UA: NEGATIVE
Specific Gravity, UA: 1.022 (ref 1.005–1.030)
Urobilinogen, Ur: 0.2 mg/dL (ref 0.2–1.0)
pH, UA: 6 (ref 5.0–7.5)

## 2024-01-05 LAB — MICROSCOPIC EXAMINATION
Casts: NONE SEEN /LPF
Epithelial Cells (non renal): >10 /HPF — AB (ref 0–10)
RBC, Urine: NONE SEEN /HPF (ref 0–2)

## 2024-01-05 LAB — CERVICOVAGINAL ANCILLARY ONLY
Chlamydia: NEGATIVE
Comment: NEGATIVE
Comment: NORMAL
Neisseria Gonorrhea: NEGATIVE

## 2024-01-05 LAB — HCV INTERPRETATION

## 2024-01-06 LAB — MONITOR DRUG PROFILE 14(MW)
Amphetamine Scrn, Ur: NEGATIVE ng/mL
BARBITURATE SCREEN URINE: NEGATIVE ng/mL
BENZODIAZEPINE SCREEN, URINE: NEGATIVE ng/mL
Buprenorphine, Urine: NEGATIVE ng/mL
CANNABINOIDS UR QL SCN: NEGATIVE ng/mL
Cocaine (Metab) Scrn, Ur: NEGATIVE ng/mL
Creatinine(Crt), U: 178.3 mg/dL (ref 20.0–300.0)
Fentanyl, Urine: NEGATIVE pg/mL
Meperidine Screen, Urine: NEGATIVE ng/mL
Methadone Screen, Urine: NEGATIVE ng/mL
OXYCODONE+OXYMORPHONE UR QL SCN: NEGATIVE ng/mL
Opiate Scrn, Ur: NEGATIVE ng/mL
Ph of Urine: 5.7 (ref 4.5–8.9)
Phencyclidine Qn, Ur: NEGATIVE ng/mL
Propoxyphene Scrn, Ur: NEGATIVE ng/mL
SPECIFIC GRAVITY: 1.026
Tramadol Screen, Urine: NEGATIVE ng/mL

## 2024-01-06 LAB — NICOTINE SCREEN, URINE: Cotinine Ql Scrn, Ur: NEGATIVE ng/mL

## 2024-01-08 LAB — CULTURE, OB URINE

## 2024-01-08 LAB — URINE CULTURE, OB REFLEX

## 2024-01-09 LAB — MATERNIT 21 PLUS CORE, BLOOD
Fetal Fraction: 24
Result (T21): NEGATIVE
Trisomy 13 (Patau syndrome): NEGATIVE
Trisomy 18 (Edwards syndrome): NEGATIVE
Trisomy 21 (Down syndrome): NEGATIVE

## 2024-01-18 ENCOUNTER — Telehealth: Payer: Self-pay

## 2024-01-18 DIAGNOSIS — O219 Vomiting of pregnancy, unspecified: Secondary | ICD-10-CM

## 2024-01-18 MED ORDER — ONDANSETRON 4 MG PO TBDP: 4.0000 mg | ORAL_TABLET | Freq: Three times a day (TID) | ORAL | 0 refills | Status: DC | PRN

## 2024-01-18 NOTE — Telephone Encounter (Signed)
 Called pt to ask if she still needed RF, no answer, LVMTRC. MyChart msg sent.

## 2024-01-18 NOTE — Addendum Note (Signed)
 Addended by: Zeric Baranowski on: 01/18/2024 10:02 AM   Modules accepted: Orders

## 2024-01-18 NOTE — Telephone Encounter (Signed)
 Cynthia Schmitt

## 2024-01-18 NOTE — Telephone Encounter (Signed)
 Pt called our office back, she needs a refil. Rx RF sent, pt aware.

## 2024-02-01 ENCOUNTER — Ambulatory Visit (INDEPENDENT_AMBULATORY_CARE_PROVIDER_SITE_OTHER): Admitting: Licensed Practical Nurse

## 2024-02-01 VITALS — BP 111/68 | HR 64 | Wt 135.6 lb

## 2024-02-01 DIAGNOSIS — Z3480 Encounter for supervision of other normal pregnancy, unspecified trimester: Secondary | ICD-10-CM

## 2024-02-01 DIAGNOSIS — Z3689 Encounter for other specified antenatal screening: Secondary | ICD-10-CM

## 2024-02-01 DIAGNOSIS — Z3A16 16 weeks gestation of pregnancy: Secondary | ICD-10-CM

## 2024-02-01 NOTE — Assessment & Plan Note (Signed)
-  Reports feeling flutters  -Some nausea but has improved, taking PRN zofran .  -Opt for AFP testing today  - Anatomy Ultasound ordered  - Warning signs and PTL signs reviewed.  - R/o in 4 weeks.

## 2024-02-01 NOTE — Progress Notes (Signed)
    Return Prenatal Note   Subjective   23 y.o. G2P1001 at [redacted]w[redacted]d presents for this follow-up prenatal visit.  Patient doing well, no concerns.  Patient reports: improvement with nausea and fetal movement..  Movement: Present Contractions: Not present  Objective   Flow sheet Vitals: Pulse Rate: 64 BP: 111/68 Fetal Heart Rate (bpm): 140 Total weight gain: 6.623 kg  General Appearance  No acute distress, well appearing, and well nourished Pulmonary   Normal work of breathing Neurologic   Alert and oriented to person, place, and time Psychiatric   Mood and affect within normal limits  Assessment/Plan   Plan  23 y.o. G2P1001 at [redacted]w[redacted]d presents for follow-up OB visit. Reviewed prenatal record including previous visit note.  Supervision of other normal pregnancy, antepartum -Reports feeling flutters  -Some nausea but has improved, taking PRN zofran .  -Opt for AFP testing today  - Anatomy Ultasound ordered  - Warning signs and PTL signs reviewed.  - R/o in 4 weeks.       Orders Placed This Encounter  Procedures   US  OB Comp + 14 Wk    Standing Status:   Future    Expected Date:   03/23/2024    Expiration Date:   01/31/2025    Reason for Exam (SYMPTOM  OR DIAGNOSIS REQUIRED):   Needs anatomy u/s    Preferred Imaging Location?:   Internal             at [facility]   AFP, Serum, Open Spina Bifida    Is patient insulin dependent?:   No    Gestational Age (GA), weeks:   40    Date on which patient was at this GA:   01/30/2024    GA Calculation Method:   Ultrasound    Number of fetuses:   1    Reason for screen:   OTHER   No follow-ups on file.   Future Appointments  Date Time Provider Department Center  02/29/2024  8:15 AM [facility] US  1 [facility]OB-[facility]G None  02/29/2024  9:35 AM Dominic, Alva Jewels, CNM [facility] None    For next visit:  continue with routine prenatal care     Malachai Schalk Montero-Diaz, Student-MidWife  02/01/2511:17 PM

## 2024-02-03 LAB — AFP, SERUM, OPEN SPINA BIFIDA
AFP MoM: 0.67
AFP Value: 24.7 ng/mL
Gest. Age on Collection Date: 16.3 wk
Maternal Age At EDD: 24.2 a
OSBR Risk 1 IN: 10000
Test Results:: NEGATIVE
Weight: 135 [lb_av]

## 2024-02-29 ENCOUNTER — Encounter: Payer: Self-pay | Admitting: Licensed Practical Nurse

## 2024-02-29 ENCOUNTER — Ambulatory Visit

## 2024-02-29 ENCOUNTER — Ambulatory Visit (INDEPENDENT_AMBULATORY_CARE_PROVIDER_SITE_OTHER): Admitting: Licensed Practical Nurse

## 2024-02-29 VITALS — BP 111/72 | HR 92 | Wt 138.7 lb

## 2024-02-29 DIAGNOSIS — Z3A20 20 weeks gestation of pregnancy: Secondary | ICD-10-CM | POA: Diagnosis not present

## 2024-02-29 DIAGNOSIS — Z3482 Encounter for supervision of other normal pregnancy, second trimester: Secondary | ICD-10-CM

## 2024-02-29 DIAGNOSIS — Z3480 Encounter for supervision of other normal pregnancy, unspecified trimester: Secondary | ICD-10-CM

## 2024-02-29 DIAGNOSIS — Z3A16 16 weeks gestation of pregnancy: Secondary | ICD-10-CM

## 2024-02-29 DIAGNOSIS — Z3689 Encounter for other specified antenatal screening: Secondary | ICD-10-CM | POA: Diagnosis not present

## 2024-02-29 NOTE — Progress Notes (Signed)
    Return Prenatal Note   Subjective   23 y.o. G2P1001 at [redacted]w[redacted]d presents for this follow-up prenatal visit.  Patient doing well, here with Cynthia Schmitt her husband Patient reports: dizziness after long periods of standing and not eating.  Movement: Present Contractions: Not present  Objective   Flow sheet Vitals: Pulse Rate: 92 BP: 111/72 Fundal Height: 20 cm Fetal Heart Rate (bpm): 140 Total weight gain: 17 lb 11.2 oz (8.029 kg)  General Appearance  No acute distress, well appearing, and well nourished Pulmonary   Normal work of breathing Neurologic   Alert and oriented to person, place, and time Psychiatric   Mood and affect within normal limits  Assessment/Plan   Plan  23 y.o. G2P1001 at [redacted]w[redacted]d presents for follow-up OB visit. Reviewed prenatal record including previous visit note.  Supervision of other normal pregnancy, antepartum - reports periods of dizziness. 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 She reports when she goes long periods of not eating, being in the sun and/ or prolonged standing she feels dizzy and some blurry vision. Discussed snacking with proteins throughout the day, increasing water intake and adding electrolyte drinks, standing in the shade and bending her knees. Heart and lungs sounds normal.  - Anatomy us  today. Declined AFP.  - PTL signs, warning signs and fetal kick counts reviewed.  - F/U in 4 weeks.       No orders of the defined types were placed in this encounter.  Return in about 4 weeks (around 03/28/2024) for ROB.   Future Appointments  Date Time Provider Department Center  03/28/2024  9:35 AM Sebastian Sham, CNM [facility] None    For next visit:  continue with routine prenatal care     Cynthia Schmitt [facility], CNM  07/14/251:39 PM

## 2024-02-29 NOTE — Assessment & Plan Note (Addendum)
-   reports periods of dizziness. 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 She reports when she goes long periods of not eating, being in the sun and/ or prolonged standing she feels dizzy and some blurry vision. Discussed snacking with proteins throughout the day, increasing water intake and adding electrolyte drinks, standing in the shade and bending her knees. Heart and lungs sounds normal.  - Anatomy us  today. Declined AFP.  - PTL signs, warning signs and fetal kick counts reviewed.  - F/U in 4 weeks.

## 2024-03-28 ENCOUNTER — Ambulatory Visit: Admitting: Certified Nurse Midwife

## 2024-03-28 ENCOUNTER — Encounter: Payer: Self-pay | Admitting: Certified Nurse Midwife

## 2024-03-28 VITALS — BP 96/62 | HR 118 | Wt 149.3 lb

## 2024-03-28 DIAGNOSIS — F419 Anxiety disorder, unspecified: Secondary | ICD-10-CM

## 2024-03-28 DIAGNOSIS — Z3482 Encounter for supervision of other normal pregnancy, second trimester: Secondary | ICD-10-CM | POA: Diagnosis not present

## 2024-03-28 DIAGNOSIS — Z3A24 24 weeks gestation of pregnancy: Secondary | ICD-10-CM

## 2024-03-28 MED ORDER — FLUOXETINE HCL 10 MG PO CAPS: 10.0000 mg | ORAL_CAPSULE | Freq: Every day | ORAL | 3 refills | Status: DC

## 2024-03-28 NOTE — Patient Instructions (Signed)
 Diphtheria; Tetanus; Pertussis (DTaP or Tdap) Vaccine Injection What is this medication? DIPHTHERIA; TETANUS; PERTUSSIS VACCINE (dif THEER ee uh; TET n us ; per TUS iss VAK seen) reduces the risk of diphtheria, tetanus (lockjaw), and pertussis (whooping cough). It does not treat diphtheria, tetanus, or pertussis. It is still possible to get diphtheria, tetanus, or pertussis after receiving this vaccine, but the symptoms may be less severe or not last as long. It works by helping your immune system learn how to fight off a future infection. This medicine may be used for other purposes; ask your health care provider or pharmacist if you have questions. COMMON BRAND NAME(S): Adacel, Boostrix, Certiva, Daptacel, Infanrix, Tripedia What should I tell my care team before I take this medication? They need to know if you have any of these conditions: Blood disorders, such as hemophilia Fever or infection Immune system problems Neurologic disease Seizures An unusual or allergic reaction to other vaccines, latex, other medications, foods, dyes, or preservatives Pregnant or trying to get pregnant Breastfeeding How should I use this medication? This vaccine is injected into a muscle. It is given by your care team. A copy of Vaccine Information Statements will be given before each vaccination. Be sure to read this information carefully each time. This sheet may change often. Talk to your care team about the use of this medication in children. While the DTaP vaccine may be given to children as young as 6 weeks and the Tdap vaccine may be given to children as young as 75 years old, precautions do apply. Overdosage: If you think you have taken too much of this medicine contact a poison control center or emergency room at once. NOTE: This medicine is only for you. Do not share this medicine with others. What if I miss a dose? It is important not to miss your dose. Call your care team if you are unable to keep an  appointment. What may interact with this medication? This medication may interact with the following: Certain medications that prevent or treat blood clots, such as warfarin, enoxaparin, dalteparin Immune globulin  Medications that lower your chance of fighting an infection, such as adalimumab, anakinra, infliximab Medications to treat cancer Steroid medications, such as prednisone  or cortisone This list may not describe all possible interactions. Give your health care provider a list of all the medicines, herbs, non-prescription drugs, or dietary supplements you use. Also tell them if you smoke, drink alcohol, or use illegal drugs. Some items may interact with your medicine. What should I watch for while using this medication? See your care team for all shots of this vaccine as directed. Report any side effects to your care team right away. This vaccine, like all vaccines, may not fully protect everyone. What side effects may I notice from receiving this medication? Side effects that you should report to your care team as soon as possible: Allergic reactions--skin rash, itching, hives, swelling of the face, lips, tongue, or throat Feeling faint or lightheaded Side effects that usually do not require medical attention (report these to your care team if they continue or are bothersome): Chills Fever General discomfort and fatigue Headache Joint pain Muscle pain Pain, redness, or irritation at injection site This list may not describe all possible side effects. Call your doctor for medical advice about side effects. You may report side effects to FDA at 1-800-FDA-1088. Where should I keep my medication? This vaccine is only given by your care team. It will not be stored at home. NOTE: This  sheet is a summary. It may not cover all possible information. If you have questions about this medicine, talk to your doctor, pharmacist, or health care provider.  2024 Elsevier/Gold Standard  (2022-02-03 00:00:00)Oral Glucose Tolerance Test During Pregnancy Why am I having this test? The oral glucose tolerance test (OGTT) is done to check how your body uses blood sugar, also called glucose. It's one of many tests used to diagnose the type of diabetes you can get while pregnant. This type of diabetes is called gestational diabetes mellitus (GDM). You may get GDM during the middle part of your pregnancy. In most cases, it goes away after you give birth. Most people get tested for GDM around weeks 24-28 of pregnancy. You may have the test sooner if: You or your mother had diabetes while pregnant. A person in your family has diabetes. You're having more than one baby this pregnancy. You've had a baby before who weighed more than 9 pounds (4 kg) at birth. You have high blood pressure or heart disease. You have a large body. You're not active. What is being tested? This test measures your blood sugar at different times. It shows how well your body uses the sugar in your blood. What kind of sample is taken?  A sample of blood is needed for this test. The sample is taken by putting a needle into a blood vessel. How do I prepare for this test? Eat your normal meals the day before the test. Your health care provider will tell you about: Eating or drinking on the day of the test. You may need to fast for 8-10 hours before the test. When you fast, you can only have water. Changing or stopping your regular medicines. Some medicines may affect your test results. Tell a health care provider about: All medicines you take. These include vitamins, herbs, eye drops, and creams. What happens during the test? The test involves these steps: Your blood sugar will be checked. It's called your fasting blood sugar if you fasted before the test. You'll drink a sugary mixture. Your blood sugar will be checked again. For a 1-hour test, it will be checked after an hour. For a 3-hour test, it will be  checked 1, 2, and 3 hours after you drink the sugary mixture. The test takes 1-3 hours. You'll need to stay at the testing place during this time. During the testing time: Do not eat or drink anything after the sugary drink. Do not exercise. Do not use any products that contain nicotine  or tobacco. These products include cigarettes, chewing tobacco, and vaping devices, such as e-cigarettes. The test may vary among providers and hospitals. How are the results reported? Your provider will compare your results to normal values for the kind of test that you had done. You may need to call or meet with your provider to get your results. What do the results mean? Your provider can tell you what blood sugar levels are normal for the test you're doing. If two or more of your blood sugar levels are at or above normal, you may be diagnosed with GDM. If only one level is high, your provider may suggest: Doing the test again. Doing other tests to confirm a diagnosis. Talk with your provider about what your results mean. Questions to ask your health care provider Ask your provider, or the department doing the test: When will my results be ready? How will I get my results? What are my next steps? This information is not intended  to replace advice given to you by your health care provider. Make sure you discuss any questions you have with your health care provider. Document Revised: 12/08/2022 Document Reviewed: 12/08/2022 Elsevier Patient Education  2024 [employer].

## 2024-03-28 NOTE — Patient Instructions (Signed)
 Diphtheria; Tetanus; Pertussis (DTaP or Tdap) Vaccine Injection What is this medication? DIPHTHERIA; TETANUS; PERTUSSIS VACCINE (dif THEER ee uh; TET n us ; per TUS iss VAK seen) reduces the risk of diphtheria, tetanus (lockjaw), and pertussis (whooping cough). It does not treat diphtheria, tetanus, or pertussis. It is still possible to get diphtheria, tetanus, or pertussis after receiving this vaccine, but the symptoms may be less severe or not last as long. It works by helping your immune system learn how to fight off a future infection. This medicine may be used for other purposes; ask your health care provider or pharmacist if you have questions. COMMON BRAND NAME(S): Adacel, Boostrix, Certiva, Daptacel, Infanrix, Tripedia What should I tell my care team before I take this medication? They need to know if you have any of these conditions: Blood disorders, such as hemophilia Fever or infection Immune system problems Neurologic disease Seizures An unusual or allergic reaction to other vaccines, latex, other medications, foods, dyes, or preservatives Pregnant or trying to get pregnant Breastfeeding How should I use this medication? This vaccine is injected into a muscle. It is given by your care team. A copy of Vaccine Information Statements will be given before each vaccination. Be sure to read this information carefully each time. This sheet may change often. Talk to your care team about the use of this medication in children. While the DTaP vaccine may be given to children as young as 6 weeks and the Tdap vaccine may be given to children as young as 37 years old, precautions do apply. Overdosage: If you think you have taken too much of this medicine contact a poison control center or emergency room at once. NOTE: This medicine is only for you. Do not share this medicine with others. What if I miss a dose? It is important not to miss your dose. Call your care team if you are unable to keep an  appointment. What may interact with this medication? This medication may interact with the following: Certain medications that prevent or treat blood clots, such as warfarin, enoxaparin, dalteparin Immune globulin  Medications that lower your chance of fighting an infection, such as adalimumab, anakinra, infliximab Medications to treat cancer Steroid medications, such as prednisone  or cortisone This list may not describe all possible interactions. Give your health care provider a list of all the medicines, herbs, non-prescription drugs, or dietary supplements you use. Also tell them if you smoke, drink alcohol, or use illegal drugs. Some items may interact with your medicine. What should I watch for while using this medication? See your care team for all shots of this vaccine as directed. Report any side effects to your care team right away. This vaccine, like all vaccines, may not fully protect everyone. What side effects may I notice from receiving this medication? Side effects that you should report to your care team as soon as possible: Allergic reactions--skin rash, itching, hives, swelling of the face, lips, tongue, or throat Feeling faint or lightheaded Side effects that usually do not require medical attention (report these to your care team if they continue or are bothersome): Chills Fever General discomfort and fatigue Headache Joint pain Muscle pain Pain, redness, or irritation at injection site This list may not describe all possible side effects. Call your doctor for medical advice about side effects. You may report side effects to FDA at 1-800-FDA-1088. Where should I keep my medication? This vaccine is only given by your care team. It will not be stored at home. NOTE: This  sheet is a summary. It may not cover all possible information. If you have questions about this medicine, talk to your doctor, pharmacist, or health care provider.  2024 Elsevier/Gold Standard  (2022-02-03 00:00:00)Oral Glucose Tolerance Test During Pregnancy Why am I having this test? The oral glucose tolerance test (OGTT) is done to check how your body uses blood sugar, also called glucose. It's one of many tests used to diagnose the type of diabetes you can get while pregnant. This type of diabetes is called gestational diabetes mellitus (GDM). You may get GDM during the middle part of your pregnancy. In most cases, it goes away after you give birth. Most people get tested for GDM around weeks 24-28 of pregnancy. You may have the test sooner if: You or your mother had diabetes while pregnant. A person in your family has diabetes. You're having more than one baby this pregnancy. You've had a baby before who weighed more than 9 pounds (4 kg) at birth. You have high blood pressure or heart disease. You have a large body. You're not active. What is being tested? This test measures your blood sugar at different times. It shows how well your body uses the sugar in your blood. What kind of sample is taken?  A sample of blood is needed for this test. The sample is taken by putting a needle into a blood vessel. How do I prepare for this test? Eat your normal meals the day before the test. Your health care provider will tell you about: Eating or drinking on the day of the test. You may need to fast for 8-10 hours before the test. When you fast, you can only have water. Changing or stopping your regular medicines. Some medicines may affect your test results. Tell a health care provider about: All medicines you take. These include vitamins, herbs, eye drops, and creams. What happens during the test? The test involves these steps: Your blood sugar will be checked. It's called your fasting blood sugar if you fasted before the test. You'll drink a sugary mixture. Your blood sugar will be checked again. For a 1-hour test, it will be checked after an hour. For a 3-hour test, it will be  checked 1, 2, and 3 hours after you drink the sugary mixture. The test takes 1-3 hours. You'll need to stay at the testing place during this time. During the testing time: Do not eat or drink anything after the sugary drink. Do not exercise. Do not use any products that contain nicotine  or tobacco. These products include cigarettes, chewing tobacco, and vaping devices, such as e-cigarettes. The test may vary among providers and hospitals. How are the results reported? Your provider will compare your results to normal values for the kind of test that you had done. You may need to call or meet with your provider to get your results. What do the results mean? Your provider can tell you what blood sugar levels are normal for the test you're doing. If two or more of your blood sugar levels are at or above normal, you may be diagnosed with GDM. If only one level is high, your provider may suggest: Doing the test again. Doing other tests to confirm a diagnosis. Talk with your provider about what your results mean. Questions to ask your health care provider Ask your provider, or the department doing the test: When will my results be ready? How will I get my results? What are my next steps? This information is not intended  to replace advice given to you by your health care provider. Make sure you discuss any questions you have with your health care provider. Document Revised: 12/08/2022 Document Reviewed: 12/08/2022 Elsevier Patient Education  2024 [employer].

## 2024-03-28 NOTE — Progress Notes (Signed)
 ROB doing wel. Feeling good fetal movement.  Pt c/o feeling overwhelmed , short tempered, irritated and a sense of doom. State she thinks she had ppd with her last baby but never did anything about it. She is interested in treatment today. Discussed risks vs benefits of medication use. She verbalizes understanding and is in agreement to use of medication. Orders placed for counseling . Orders placed for prozac  10 mg daily      03/28/2024    9:57 AM 01/04/2024   10:30 AM  GAD 7 : Generalized Anxiety Score  Nervous, Anxious, on Edge 3 1  Control/stop worrying 3 1  Worry too much - different things 3 1  Trouble relaxing 2 0  Restless 2 0  Easily annoyed or irritable 3 2  Afraid - awful might happen 2 0  Total GAD 7 Score 18 5  Anxiety Difficulty  Somewhat difficult     Flowsheet Row Routine Prenatal from 03/28/2024 in [facility] at [facility] Total Score [address], [place]

## 2024-03-29 ENCOUNTER — Other Ambulatory Visit: Payer: Self-pay

## 2024-03-29 DIAGNOSIS — Z130 Encounter for screening for diseases of the blood and blood-forming organs and certain disorders involving the immune mechanism: Secondary | ICD-10-CM

## 2024-03-29 DIAGNOSIS — Z3A28 28 weeks gestation of pregnancy: Secondary | ICD-10-CM

## 2024-03-29 DIAGNOSIS — Z131 Encounter for screening for diabetes mellitus: Secondary | ICD-10-CM

## 2024-03-29 DIAGNOSIS — O26899 Other specified pregnancy related conditions, unspecified trimester: Secondary | ICD-10-CM

## 2024-03-29 DIAGNOSIS — Z113 Encounter for screening for infections with a predominantly sexual mode of transmission: Secondary | ICD-10-CM

## 2024-04-18 ENCOUNTER — Other Ambulatory Visit: Payer: Self-pay | Admitting: Licensed Practical Nurse

## 2024-04-18 DIAGNOSIS — O219 Vomiting of pregnancy, unspecified: Secondary | ICD-10-CM

## 2024-04-25 ENCOUNTER — Ambulatory Visit (INDEPENDENT_AMBULATORY_CARE_PROVIDER_SITE_OTHER): Admitting: Advanced Practice Midwife

## 2024-04-25 ENCOUNTER — Encounter: Payer: Self-pay | Admitting: Advanced Practice Midwife

## 2024-04-25 ENCOUNTER — Other Ambulatory Visit

## 2024-04-25 VITALS — BP 97/63 | HR 99 | Wt 155.1 lb

## 2024-04-25 DIAGNOSIS — F909 Attention-deficit hyperactivity disorder, unspecified type: Secondary | ICD-10-CM | POA: Insufficient documentation

## 2024-04-25 DIAGNOSIS — O360130 Maternal care for anti-D [Rh] antibodies, third trimester, not applicable or unspecified: Secondary | ICD-10-CM

## 2024-04-25 DIAGNOSIS — Z130 Encounter for screening for diseases of the blood and blood-forming organs and certain disorders involving the immune mechanism: Secondary | ICD-10-CM

## 2024-04-25 DIAGNOSIS — Z3483 Encounter for supervision of other normal pregnancy, third trimester: Secondary | ICD-10-CM

## 2024-04-25 DIAGNOSIS — Z131 Encounter for screening for diabetes mellitus: Secondary | ICD-10-CM

## 2024-04-25 DIAGNOSIS — Z113 Encounter for screening for infections with a predominantly sexual mode of transmission: Secondary | ICD-10-CM

## 2024-04-25 DIAGNOSIS — O26899 Other specified pregnancy related conditions, unspecified trimester: Secondary | ICD-10-CM

## 2024-04-25 DIAGNOSIS — Z23 Encounter for immunization: Secondary | ICD-10-CM | POA: Diagnosis not present

## 2024-04-25 DIAGNOSIS — Z3A28 28 weeks gestation of pregnancy: Secondary | ICD-10-CM | POA: Diagnosis not present

## 2024-04-25 MED ORDER — RHO D IMMUNE GLOBULIN 1500 UNIT/2ML IJ SOSY
300.0000 ug | PREFILLED_SYRINGE | Freq: Once | INTRAMUSCULAR | Status: AC
Start: 2024-04-25 — End: 2024-04-25
  Administered 2024-04-25: 300 ug via INTRAMUSCULAR

## 2024-04-25 NOTE — Progress Notes (Addendum)
 Routine Prenatal Care Visit  Subjective  Cynthia Schmitt is a 23 y.o. G2P1001 at [redacted]w[redacted]d being seen today for ongoing prenatal care.  She is currently monitored for the following issues for this low-risk pregnancy and has Rh negative state in antepartum period; Supervision of other normal pregnancy, antepartum; and ADHD (attention deficit hyperactivity disorder) on their problem list.  ----------------------------------------------------------------------------------- Patient reports she is doing well. Using saline nasal spray for dryness/occasional nose bleed.    Rhogam was administered prior to lab draw for 28wk labs.  Contractions: Not present. Vag. Bleeding: None.  Movement: Present. Leaking Fluid denies.  ----------------------------------------------------------------------------------- The following portions of the patient's history were reviewed and updated as appropriate: allergies, current medications, past family history, past medical history, past social history, past surgical history and problem list. Problem list updated.  Objective  Blood pressure 97/63, pulse 99, weight 155 lb 1.6 oz (70.4 kg), last menstrual period 10/05/2023. Pregravid weight 121 lb (54.9 kg) Total Weight Gain 34 lb 1.6 oz (15.5 kg) Urinalysis: Urine Protein    Urine Glucose    Fetal Status: Fetal Heart Rate (bpm): 145 Fundal Height: 28 cm Movement: Present     General:  Alert, oriented and cooperative. Patient is in no acute distress.  Skin: Skin is warm and dry. No rash noted.   Cardiovascular: Normal heart rate noted  Respiratory: Normal respiratory effort, no problems with respiration noted  Abdomen: Soft, gravid, appropriate for gestational age. Pain/Pressure: Absent     Pelvic:  Cervical exam deferred        Extremities: Normal range of motion.  Edema: None  Mental Status: Normal mood and affect. Normal behavior. Normal judgment and thought content.   Assessment   23 y.o. G2P1001 at [redacted]w[redacted]d by   07/16/2024, by Ultrasound presenting for routine prenatal visit  Plan   G2 Problems (from 11/04/23 to present)     Problem Noted Diagnosed Resolved   Supervision of other normal pregnancy, antepartum 11/27/2023 by Tamea Annalee DEL, CMA  No   Overview Addendum 04/25/2024  9:10 AM by Lynda Bradley, CNM   Clinical Staff Provider  Office Location  [facility] Dating  By 8w u/s  Language  English Anatomy US     Flu Vaccine  N/a Genetic Screen  NIPS: negative, female  RSV Vaccine  N/a    Covid Vaccine  Has not    TDaP vaccine   offer Hgb A1C or  GTT Early : not done Third trimester :   Covid Has not   LAB RESULTS   Rhogam  A/Negative/-- (05/19 1035)  Blood Type A/Negative/-- (05/19 1035)   RSV N/a Antibody Negative (05/19 1035)  Feeding Plan breast Rubella 1.47 (05/19 1035)  Contraception NONE RPR Non Reactive (05/19 1035)   Circumcision Yes if boy HBsAg Negative (05/19 1035)   Pediatrician  [place] HIV Non Reactive (05/19 1035)  Support Person Husband and  Varicella Non Reactive (05/19 1035)  Prenatal Classes  GBS  (For PCN allergy, check sensitivities)     Hep C Non Reactive (05/19 1035)   BTL Consent  Pap Diagnosis  Date Value Ref Range Status  07/09/2021   Final   - Negative for intraepithelial lesion or malignancy (NILM)    VBAC Consent NA Hgb Electro      CF      SMA                    Preterm labor symptoms and general obstetric precautions including but not limited to vaginal  bleeding, contractions, leaking of fluid and fetal movement were reviewed in detail with the patient. Please refer to After Visit Summary for other counseling recommendations.   Return in about 2 weeks (around 05/09/2024) for rob.  Slater Rains, CNM 04/25/2024 9:19 AM

## 2024-04-25 NOTE — Patient Instructions (Signed)
 Third Trimester of Pregnancy  The third trimester of pregnancy is from week 28 through week 40. This is months 7 through 9. The third trimester is a time when your baby is growing fast. Body changes during your third trimester Your body continues to change during this time. The changes usually go away after your baby is born. Physical changes You will continue to gain weight. You may get stretch marks on your hips, belly, and breasts. Your breasts will keep growing and may hurt. A yellow fluid (colostrum) may leak from your breasts. This is the first milk you're making for your baby. Your hair may grow faster and get thicker. In some cases, you may get hair loss. Your belly button may stick out. You may have more swelling in your hands, face, or ankles. Health changes You may have heartburn. You may feel short of breath. This is caused by the uterus that is now bigger. You may have more aches in the pelvis, back, or thighs. You may have more tingling or numbness in your hands, arms, and legs. You may pee more often. You may have trouble pooping (constipation) or swollen veins in the butt that can itch or get painful (hemorrhoids). Other changes You may have more problems sleeping. You may notice the baby moving lower in your belly (dropping). You may have more fluid coming from your vagina. Your joints may feel loose, and you may have pain around your pelvic bone. Follow these instructions at home: Medicines Take medicines only as told by your health care provider. Some medicines are not safe during pregnancy. Your provider may change the medicines that you take. Do not take any medicines unless told to by your provider. Take a prenatal vitamin that has at least 600 micrograms (mcg) of folic acid. Do not use herbal medicines, illegal drugs, or medicines that are not approved by your provider. Eating and drinking While you're pregnant your body needs additional nutrition to help  support your growing baby. Talk with your provider about your nutritional needs. Activity Most women are able to exercise regularly during pregnancy. Exercise routines may need to change at the end of your pregnancy. Talk to your provider about your activities and exercise routine. Relieving pain and discomfort Rest often with your legs raised if you have leg cramps or low back pain. Take warm sitz baths to soothe pain from hemorrhoids. Use hemorrhoid cream if your provider says it's okay. Wear a good, supportive bra if your breasts hurt. Do not use hot tubs, steam rooms, or saunas. Do not douche. Do not use tampons or scented pads. Safety Talk to your provider before traveling far distances. Wear your seatbelt at all times when you're in a car. Talk to your provider if someone hits you, hurts you, or yells at you. Preparing for birth To prepare for your baby: Take childbirth and breastfeeding classes. Visit the hospital and tour the maternity area. Buy a rear-facing car seat. Learn how to install it in your car. General instructions Avoid cat litter boxes and soil used by cats. These things carry germs that can cause harm to your pregnancy and your baby. Do not drink alcohol, smoke, vape, or use products with nicotine or tobacco in them. If you need help quitting, talk with your provider. Keep all follow-up visits for your third trimester. Your provider will do more exams and tests during this trimester. Write down your questions. Take them to your prenatal visits. Your provider also will: Talk with you about  your overall health. Give you advice or refer you to specialists who can help with different needs, including: Mental health and counseling. Foods and healthy eating. Ask for help if you need help with food. Where to find more information American Pregnancy Association: americanpregnancy.org [employer]ians [employer] Gynecologists: acog.org Office on [employer] Health:  [employer] Contact a health care provider if: You have a headache that does not go away when you take medicine. You have any of these problems: You can't eat or drink. You have nausea and vomiting. You have watery poop (diarrhea) for 2 days or more. You have pain when you pee, or your pee smells bad. You have been sick for 2 days or more and aren't getting better. Contact your provider right away if: You have any of these coming from your vagina: Abnormal discharge. Bad-smelling fluid. Bleeding. Your baby is moving less than usual. You have signs of labor: You have any contractions, belly cramping, or have pain in your pelvis or lower back before 37 weeks of pregnancy (preterm labor). You have regular contractions that are less than 5 minutes apart. Your water breaks. You have symptoms of high blood pressure or preeclampsia. These include: A severe, throbbing headache that does not go away. Sudden or extreme swelling of your face, hands, legs, or feet. Vision problems: You see spots. You have blurry vision. Your eyes are sensitive to light. If you can't reach your provider, go to an urgent care or emergency room. Get help right away if: You faint, become confused, or can't think clearly. You have chest pain or trouble breathing. You have any kind of injury, such as from a fall or a car crash. These symptoms may be an emergency. Call 911 right away. Do not wait to see if the symptoms will go away. Do not drive yourself to the hospital. This information is not intended to replace advice given to you by your health care provider. Make sure you discuss any questions you have with your health care provider. Document Revised: 05/07/2023 Document Reviewed: 12/05/2022 Elsevier Patient Education  2024 [employer].

## 2024-04-26 LAB — 28 WEEKS RH-PANEL
Antibody Screen: NEGATIVE
Basophils Absolute: 0 x10E3/uL (ref 0.0–0.2)
Basos: 0 %
EOS (ABSOLUTE): 0.1 x10E3/uL (ref 0.0–0.4)
Eos: 1 %
Gestational Diabetes Screen: 71 mg/dL (ref 70–139)
HIV Screen 4th Generation wRfx: NONREACTIVE
Hematocrit: 32.4 % — ABNORMAL LOW (ref 34.0–46.6)
Hemoglobin: 10.5 g/dL — ABNORMAL LOW (ref 11.1–15.9)
Immature Grans (Abs): 0 x10E3/uL (ref 0.0–0.1)
Immature Granulocytes: 0 %
Lymphocytes Absolute: 1.6 x10E3/uL (ref 0.7–3.1)
Lymphs: 18 %
MCH: 29.3 pg (ref 26.6–33.0)
MCHC: 32.4 g/dL (ref 31.5–35.7)
MCV: 91 fL (ref 79–97)
Monocytes Absolute: 0.8 x10E3/uL (ref 0.1–0.9)
Monocytes: 9 %
Neutrophils Absolute: 6.3 x10E3/uL (ref 1.4–7.0)
Neutrophils: 72 %
Platelets: 204 x10E3/uL (ref 150–450)
RBC: 3.58 x10E6/uL — ABNORMAL LOW (ref 3.77–5.28)
RDW: 11.8 % (ref 11.7–15.4)
RPR Ser Ql: NONREACTIVE
WBC: 8.9 x10E3/uL (ref 3.4–10.8)

## 2024-05-01 ENCOUNTER — Ambulatory Visit: Payer: Self-pay | Admitting: Advanced Practice Midwife

## 2024-05-09 ENCOUNTER — Ambulatory Visit (INDEPENDENT_AMBULATORY_CARE_PROVIDER_SITE_OTHER): Admitting: Certified Nurse Midwife

## 2024-05-09 ENCOUNTER — Encounter: Payer: Self-pay | Admitting: Certified Nurse Midwife

## 2024-05-09 VITALS — BP 99/68 | HR 99 | Wt 160.7 lb

## 2024-05-09 DIAGNOSIS — Z3A30 30 weeks gestation of pregnancy: Secondary | ICD-10-CM

## 2024-05-09 DIAGNOSIS — Z3483 Encounter for supervision of other normal pregnancy, third trimester: Secondary | ICD-10-CM

## 2024-05-09 DIAGNOSIS — O99413 Diseases of the circulatory system complicating pregnancy, third trimester: Secondary | ICD-10-CM | POA: Diagnosis not present

## 2024-05-09 DIAGNOSIS — I863 Vulval varices: Secondary | ICD-10-CM

## 2024-05-09 DIAGNOSIS — Z3480 Encounter for supervision of other normal pregnancy, unspecified trimester: Secondary | ICD-10-CM

## 2024-05-09 NOTE — Progress Notes (Signed)
    Return Prenatal Note   Subjective   23 y.o. G2P1001 at [redacted]w[redacted]d presents for this follow-up prenatal visit.  Patient is doing well. She has no new conerns today.  Patient reports: Movement: Present Contractions: Not present  Objective   Flow sheet Vitals: Pulse Rate: 99 BP: 99/68 Total weight gain: 39 lb 11.2 oz (18 kg)  General Appearance  No acute distress, well appearing, and well nourished Pulmonary   Normal work of breathing Neurologic   Alert and oriented to person, place, and time Psychiatric   Mood and affect within normal limits   Assessment/Plan   Plan  23 y.o. G2P1001 at [redacted]w[redacted]d presents for follow-up OB visit. Reviewed prenatal record including previous visit note.  Labial varicosities New onset labia swelling with varicosities noted on right labia today. Tatijana describes it as uncomfortable but not painful and worse after working. Reviewed comfort measures including compression and ice packs. Will let us  know if it gets worse.   Supervision of other normal pregnancy, antepartum Has begun Fe for mild anemia without side effects. Reviewed kick counts and preterm labor warning signs. Instructed to call office or come to hospital with persistent headache, vision changes, regular contractions, leaking of fluid, decreased fetal movement or vaginal bleeding.       No orders of the defined types were placed in this encounter.  No follow-ups on file.   Future Appointments  Date Time Provider Department Center  05/25/2024  8:35 AM Starla Harland BROCKS, MD [facility] None    For next visit:  continue with routine prenatal care     Damien Parsley, CNM [facility]  09/22/258:58 AM

## 2024-05-09 NOTE — Patient Instructions (Signed)
 Third Trimester of Pregnancy  The third trimester of pregnancy is from week 28 through week 40. This is months 7 through 9. The third trimester is a time when your baby is growing fast. Body changes during your third trimester Your body continues to change during this time. The changes usually go away after your baby is born. Physical changes You will continue to gain weight. You may get stretch marks on your hips, belly, and breasts. Your breasts will keep growing and may hurt. A yellow fluid (colostrum) may leak from your breasts. This is the first milk you're making for your baby. Your hair may grow faster and get thicker. In some cases, you may get hair loss. Your belly button may stick out. You may have more swelling in your hands, face, or ankles. Health changes You may have heartburn. You may feel short of breath. This is caused by the uterus that is now bigger. You may have more aches in the pelvis, back, or thighs. You may have more tingling or numbness in your hands, arms, and legs. You may pee more often. You may have trouble pooping (constipation) or swollen veins in the butt that can itch or get painful (hemorrhoids). Other changes You may have more problems sleeping. You may notice the baby moving lower in your belly (dropping). You may have more fluid coming from your vagina. Your joints may feel loose, and you may have pain around your pelvic bone. Follow these instructions at home: Medicines Take medicines only as told by your health care provider. Some medicines are not safe during pregnancy. Your provider may change the medicines that you take. Do not take any medicines unless told to by your provider. Take a prenatal vitamin that has at least 600 micrograms (mcg) of folic acid. Do not use herbal medicines, illegal drugs, or medicines that are not approved by your provider. Eating and drinking While you're pregnant your body needs additional nutrition to help  support your growing baby. Talk with your provider about your nutritional needs. Activity Most women are able to exercise regularly during pregnancy. Exercise routines may need to change at the end of your pregnancy. Talk to your provider about your activities and exercise routine. Relieving pain and discomfort Rest often with your legs raised if you have leg cramps or low back pain. Take warm sitz baths to soothe pain from hemorrhoids. Use hemorrhoid cream if your provider says it's okay. Wear a good, supportive bra if your breasts hurt. Do not use hot tubs, steam rooms, or saunas. Do not douche. Do not use tampons or scented pads. Safety Talk to your provider before traveling far distances. Wear your seatbelt at all times when you're in a car. Talk to your provider if someone hits you, hurts you, or yells at you. Preparing for birth To prepare for your baby: Take childbirth and breastfeeding classes. Visit the hospital and tour the maternity area. Buy a rear-facing car seat. Learn how to install it in your car. General instructions Avoid cat litter boxes and soil used by cats. These things carry germs that can cause harm to your pregnancy and your baby. Do not drink alcohol, smoke, vape, or use products with nicotine or tobacco in them. If you need help quitting, talk with your provider. Keep all follow-up visits for your third trimester. Your provider will do more exams and tests during this trimester. Write down your questions. Take them to your prenatal visits. Your provider also will: Talk with you about  your overall health. Give you advice or refer you to specialists who can help with different needs, including: Mental health and counseling. Foods and healthy eating. Ask for help if you need help with food. Where to find more information American Pregnancy Association: americanpregnancy.org [employer]ians [employer] Gynecologists: acog.org Office on [employer] Health:  [employer] Contact a health care provider if: You have a headache that does not go away when you take medicine. You have any of these problems: You can't eat or drink. You have nausea and vomiting. You have watery poop (diarrhea) for 2 days or more. You have pain when you pee, or your pee smells bad. You have been sick for 2 days or more and aren't getting better. Contact your provider right away if: You have any of these coming from your vagina: Abnormal discharge. Bad-smelling fluid. Bleeding. Your baby is moving less than usual. You have signs of labor: You have any contractions, belly cramping, or have pain in your pelvis or lower back before 37 weeks of pregnancy (preterm labor). You have regular contractions that are less than 5 minutes apart. Your water breaks. You have symptoms of high blood pressure or preeclampsia. These include: A severe, throbbing headache that does not go away. Sudden or extreme swelling of your face, hands, legs, or feet. Vision problems: You see spots. You have blurry vision. Your eyes are sensitive to light. If you can't reach your provider, go to an urgent care or emergency room. Get help right away if: You faint, become confused, or can't think clearly. You have chest pain or trouble breathing. You have any kind of injury, such as from a fall or a car crash. These symptoms may be an emergency. Call 911 right away. Do not wait to see if the symptoms will go away. Do not drive yourself to the hospital. This information is not intended to replace advice given to you by your health care provider. Make sure you discuss any questions you have with your health care provider. Document Revised: 05/07/2023 Document Reviewed: 12/05/2022 Elsevier Patient Education  2024 [employer].

## 2024-05-09 NOTE — Assessment & Plan Note (Signed)
 Has begun Fe for mild anemia without side effects. Reviewed kick counts and preterm labor warning signs. Instructed to call office or come to hospital with persistent headache, vision changes, regular contractions, leaking of fluid, decreased fetal movement or vaginal bleeding.

## 2024-05-09 NOTE — Progress Notes (Signed)
    Return Prenatal Note   Subjective   23 y.o. G2P1001 at [redacted]w[redacted]d presents for this follow-up prenatal visit.  Patient is doing well. She has no new conerns today.  Patient reports: Movement: Present Contractions: Not present  Objective   Flow sheet Vitals: Pulse Rate: 99 BP: 99/68 Total weight gain: 39 lb 11.2 oz (18 kg)  General Appearance  No acute distress, well appearing, and well nourished Pulmonary   Normal work of breathing Neurologic   Alert and oriented to person, place, and time Psychiatric   Mood and affect within normal limits   Assessment/Plan   Plan  23 y.o. G2P1001 at [redacted]w[redacted]d presents for follow-up OB visit. Reviewed prenatal record including previous visit note.  No problem-specific Assessment & Plan notes found for this encounter.      No orders of the defined types were placed in this encounter.  No follow-ups on file.   No future appointments.  For next visit:  continue with routine prenatal care     Damien Parsley, CNM [facility] 09/22/258:30 AM

## 2024-05-09 NOTE — Assessment & Plan Note (Signed)
 New onset labia swelling with varicosities noted on right labia today. Charly describes it as uncomfortable but not painful and worse after working. Reviewed comfort measures including compression and ice packs. Will let us  know if it gets worse.

## 2024-05-25 ENCOUNTER — Encounter: Admitting: Obstetrics & Gynecology

## 2024-05-26 ENCOUNTER — Encounter: Payer: Self-pay | Admitting: Advanced Practice Midwife

## 2024-05-26 ENCOUNTER — Ambulatory Visit (INDEPENDENT_AMBULATORY_CARE_PROVIDER_SITE_OTHER): Admitting: Advanced Practice Midwife

## 2024-05-26 VITALS — BP 88/56 | HR 90 | Wt 162.6 lb

## 2024-05-26 DIAGNOSIS — Z2911 Encounter for prophylactic immunotherapy for respiratory syncytial virus (RSV): Secondary | ICD-10-CM

## 2024-05-26 DIAGNOSIS — Z3480 Encounter for supervision of other normal pregnancy, unspecified trimester: Secondary | ICD-10-CM

## 2024-05-26 DIAGNOSIS — Z3483 Encounter for supervision of other normal pregnancy, third trimester: Secondary | ICD-10-CM | POA: Diagnosis not present

## 2024-05-26 DIAGNOSIS — Z23 Encounter for immunization: Secondary | ICD-10-CM

## 2024-05-26 DIAGNOSIS — Z3A32 32 weeks gestation of pregnancy: Secondary | ICD-10-CM | POA: Diagnosis not present

## 2024-05-26 NOTE — Progress Notes (Signed)
 Routine Prenatal Care Visit  Subjective  Cynthia Schmitt is a 23 y.o. G2P1001 at [redacted]w[redacted]d being seen today for ongoing prenatal care.  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  ----------------------------------------------------------------------------------- Patient reports she is doing well. Has questions about waterbirth policy. Suggested having next appointment with Annie, Jessica or Lydia to discuss logistics.  RSV vaccine today. Contractions: Not present. Vag. Bleeding: None.  Movement: Present. Leaking Fluid denies.  ----------------------------------------------------------------------------------- The following portions of the patient's history were reviewed and updated as appropriate: allergies, current medications, past family history, past medical history, past social history, past surgical history and problem list. Problem list updated.  Objective  BP (!) 88/56   Pulse 90   Wt 162 lb 9.6 oz (73.8 kg)   LMP 10/05/2023   BMI 26.24 kg/m  Pregravid weight 121 lb (54.9 kg) Total Weight Gain 41 lb 9.6 oz (18.9 kg) Urinalysis: Urine Protein    Urine Glucose    Fetal Status: Fetal Heart Rate (bpm): 137 Fundal Height: 32 cm Movement: Present      General:  Alert, oriented and cooperative. Patient is in no acute distress.  Skin: Skin is warm and dry. No rash noted.   Cardiovascular: Normal heart rate noted  Respiratory: Normal respiratory effort, no problems with respiration noted  Abdomen: Soft, gravid, appropriate for gestational age. Pain/Pressure: Absent     Pelvic:  Cervical exam deferred        Extremities: Normal range of motion.  Edema: None  Mental Status: Normal mood and affect. Normal behavior. Normal judgment and thought content.    Assessment   23 y.o. G2P1001 at [redacted]w[redacted]d by  07/16/2024, by Ultrasound presenting for routine prenatal visit  Plan   G2 Problems (from 11/04/23 to present)     Problem Noted Diagnosed Resolved   Supervision of other normal pregnancy, antepartum 11/27/2023 by Tamea Annalee DEL, CMA  No   Overview Addendum 05/26/2024  8:28 AM by Taft Salines, LPN   Clinical Staff Provider  Office Location  [facility] Dating  By 8w u/s  Language  English Anatomy US   02/29/24 WNL  Flu Vaccine  04/25/24 Genetic Screen  NIPS: negative, female  RSV Vaccine  05/26/24    Covid Vaccine  Has not    TDaP vaccine  04/25/24 Hgb A1C or  GTT Early : not done Third trimester : 04/25/24 WNL  Covid Has not   LAB RESULTS   Rhogam  A/Negative/-- (05/19 1035) 04/25/24 Blood Type A/Negative/-- (05/19 1035)   RSV 05/26/24 Antibody Negative (09/08 0926)  Feeding Plan breast Rubella 1.47 (05/19 1035)  Contraception NONE RPR Non Reactive (09/08 0926)   Circumcision N/A HBsAg Negative (05/19 1035)   Pediatrician  [place] HIV Non Reactive (09/08 0926)  Support Person Husband and  Varicella Non Reactive (05/19 1035)  Prenatal Classes Yes GBS  (For PCN allergy, check sensitivities)     Hep C Non Reactive (05/19 1035)   BTL Consent N/A Pap Diagnosis  Date Value Ref Range Status  07/09/2021   Final   - Negative for intraepithelial lesion or malignancy (NILM)    VBAC Consent NA Hgb Electro      CF      SMA  Preterm labor symptoms and general obstetric precautions including but not limited to vaginal bleeding, contractions, leaking of fluid and fetal movement were reviewed in detail with the patient. Please refer to After Visit Summary for other counseling recommendations.   Return in about 2 weeks (around 06/09/2024) for rob.  Slater Rains, CNM 05/26/2024 8:50 AM

## 2024-05-26 NOTE — Patient Instructions (Addendum)
 Prenatal Classes offered through [facility] [email or web address]  Doula Website [email or web address]  RSV Vaccine: What You Need to Know Many vaccine information statements are available in Spanish and other languages. See ClassThemes.[email or web address]. 1. Why get vaccinated? RSV vaccine can prevent lower respiratory tract disease caused by respiratory syncytial virus (RSV). RSV is a common respiratory virus that usually causes mild, cold-like symptoms. RSV can cause illness in people of all ages but may be especially serious for infants and older adults. RSV is the most common cause of hospitalization in [place] infants. Infants up to 29 months of age (especially those 6 months and younger) and children who were born prematurely, or who have chronic lung or heart disease, or a weakened immune system, are at increased risk of severe RSV disease. RSV infections can be dangerous for certain adults. Adults at highest risk for severe RSV disease include older adults, especially those with chronic heart or lung disease, a weakened immune system, certain other chronic medical conditions, or who live in nursing homes. RSV spreads through direct contact with the virus, such as when droplets from an infected person's cough or sneeze contact your eyes, nose, or mouth. It can also be spread by someone touching a surface, such as a doorknob, that has the virus on it, and then touching your face. Symptoms of RSV infection may include runny nose, decreased appetite, coughing, sneezing, fever, or wheezing. In very young infants, symptoms of RSV may also include irritability (fussiness), decreased activity, or apnea (pauses in breathing for more than 10 seconds). Most people recover in a week or two, but RSV can be more serious, resulting in shortness of breath and low oxygen levels. RSV can cause bronchiolitis  (inflammation of the small airways in the lung) and pneumonia (infection of the lungs). RSV can also lead to worsening of other medical conditions such as asthma, chronic obstructive pulmonary disease (a chronic disease of the lungs that makes it hard to breathe), or heart failure (when the heart cannot pump enough blood and oxygen throughout the body). Infants and older adults who get very sick from RSV may need to be hospitalized. Some may even die. 2. RSV vaccine There are two immunization options available for protecting infants against RSV: maternal vaccine for the pregnant person or preventive antibodies given to the baby. Only one of these options is needed for most babies to be protected. CDC recommends a one-time dose of RSV vaccine for pregnant people from week 32 through week 36 of pregnancy for the prevention of RSV disease in their infants during the first 6 months of life. This vaccine is recommended to be given from September through January for most of the [place] . However, in some locations (for example, the territories, [place] , [place] , and parts of [place] ), the timing of vaccination may differ based on the time of year when RSV circulates in the area. CDC recommends a one-time-dose of RSV vaccine for everyone 75 years and older and for adults 50 through 24 years of age who are at increased risk of severe RSV disease. Adults 30 through 24 years old who are at increased risk include those with chronic heart or lung disease, a weakened immune system, or certain other chronic medical conditions, and those who are residents of nursing homes. RSV vaccine may be given at the same time as other vaccines. 3. Talk with your health care provider Tell your vaccination provider if the person getting the vaccine: Has had an allergic reaction after a previous dose of RSV vaccine,  or has any severe, life-threatening allergies In some cases, your health care provider may decide to postpone RSV  vaccination until a future visit.  People with minor illnesses, such as a cold, may be vaccinated. People who are moderately or severely ill should usually wait until they recover before getting RSV vaccine.  Your health care provider can give you more information. 4. Risks of a vaccine reaction Pain, redness, and swelling where the shot is given, fatigue (feeling tired), fever, headache, nausea, diarrhea, and muscle or joint pain can happen after RSV vaccination. Serious neurologic conditions, including Guillain-Barr syndrome (GBS), have been reported after RSV vaccination in some older adults. At this time, an increased risk of GBS following RSV vaccine among persons aged 76 years and older cannot be confirmed or ruled out. Preterm birth and high blood pressure during pregnancy, including pre-eclampsia, have been reported among pregnant people who received RSV vaccine. It is unclear whether these events were caused by the vaccine. People sometimes faint after medical procedures, including vaccination. Tell your provider if you feel dizzy or have vision changes or ringing in the ears.  As with any medicine, there is a very remote chance of a vaccine causing a severe allergic reaction, other serious injury, or death. V-Safe is a safety monitoring system that lets you share with CDC how you, or your dependent, feel after getting RSV vaccine. You can find information and enroll in V-Safe RadarLocations.no. 5. What if there is a serious problem? An allergic reaction could occur after the vaccinated person leaves the clinic. If you see signs of a severe allergic reaction (hives, swelling of the face and throat, difficulty breathing, a fast heartbeat, dizziness, or weakness), call 9-1-1 and get the person to the nearest hospital. For other signs that concern you, call your health care provider.  Adverse reactions should be reported to the Vaccine Adverse Event Reporting System (VAERS). Your health care  provider will usually file this report, or you can do it yourself. Visit the VAERS website at www.vaers.LAgents.no or call 843 657 5965. VAERS is only for reporting reactions, and VAERS staff members do not give medical advice. 6. How can I learn more? Ask your health care provider. Call your local or state health department. Visit the website of the Food and Drug Administration (FDA) for vaccine package inserts and additional information at FinderList.no Contact the Centers for Disease Control and Prevention (CDC): Call (901)204-0085 (1-800-CDC-INFO) or Visit CDC's vaccine website at PicCapture.uy Source: CDC Vaccine Information Statement RSV (Respiratory Syncytial Virus) Vaccine (06/04/2023) This same material is available at TonerPromos.no for no charge. This information is not intended to replace advice given to you by your health care provider. Make sure you discuss any questions you have with your health care provider. Document Revised: 06/30/2023 Document Reviewed: 08/20/2022 Elsevier Patient Education  2024 Elsevier Inc. Fetal Movement Counts When you're pregnant, you might start feeling your baby move around the middle of your pregnancy. At first, these movements might feel like flutters, rolls, or swishes. As your baby grows, you might feel more kicks and jabs. Around week 28 of your pregnancy, your health care team may ask you to count how often your baby moves. This is important for all pregnancies, but especially for high-risk ones. Counting movements can help lessen the risk of stillbirth. What is a fetal movement count? A fetal movement count is the number of times that you feel your baby move during a certain amount of time. This may also be called a kick count.  There are many ways to do a kick count. Ask your team what is best for you. Pay attention to when your baby is most active. You may notice your baby's sleep and wake cycles. You may also  notice things that make your baby move more. When you do a kick count, try to do it: When your baby is normally most active. At the same time each day. How do I count fetal movements?  Find a quiet, comfortable area. Sit or lie down. Write down the date, the start time, and the number of movements you feel. Count kicks, flutters, swishes, rolls, and jabs. Usually, you will feel at least 10 movements within 2 hours. Stop counting after you have felt 10 movements or if you have been counting for 2 hours. Write down the stop time. Contact a health care provider if: You don't feel 10 movements in 2 hours. Your baby isn't moving as it usually does. Your baby isn't moving at all. If you're not able to reach your provider, go to an emergency room. This information is not intended to replace advice given to you by your health care provider. Make sure you discuss any questions you have with your health care provider. Document Revised: 08/28/2023 Document Reviewed: 08/20/2022 Elsevier Patient Education  2025 [employer]. Third Trimester of Pregnancy  The third trimester of pregnancy is from week 28 through week 40. This is months 7 through 9. The third trimester is a time when your baby is growing fast. Body changes during your third trimester Your body continues to change during this time. The changes usually go away after your baby is born. Physical changes You will continue to gain weight. You may get stretch marks on your hips, belly, and breasts. Your breasts will keep growing and may hurt. A yellow fluid (colostrum) may leak from your breasts. This is the first milk you're making for your baby. Your hair may grow faster and get thicker. In some cases, you may get hair loss. Your belly button may stick out. You may have more swelling in your hands, face, or ankles. Health changes You may have heartburn. You may feel short of breath. This is caused by the uterus that is now bigger. You  may have more aches in the pelvis, back, or thighs. You may have more tingling or numbness in your hands, arms, and legs. You may pee more often. You may have trouble pooping (constipation) or swollen veins in the butt that can itch or get painful (hemorrhoids). Other changes You may have more problems sleeping. You may notice the baby moving lower in your belly (dropping). You may have more fluid coming from your vagina. Your joints may feel loose, and you may have pain around your pelvic bone. Follow these instructions at home: Medicines Take medicines only as told by your health care provider. Some medicines are not safe during pregnancy. Your provider may change the medicines that you take. Do not take any medicines unless told to by your provider. Take a prenatal vitamin that has at least 600 micrograms (mcg) of folic acid. Do not use herbal medicines, illegal drugs, or medicines that are not approved by your provider. Eating and drinking While you're pregnant your body needs additional nutrition to help support your growing baby. Talk with your provider about your nutritional needs. Activity Most women are able to exercise regularly during pregnancy. Exercise routines may need to change at the end of your pregnancy. Talk to your provider about your  activities and exercise routine. Relieving pain and discomfort Rest often with your legs raised if you have leg cramps or low back pain. Take warm sitz baths to soothe pain from hemorrhoids. Use hemorrhoid cream if your provider says it's okay. Wear a good, supportive bra if your breasts hurt. Do not use hot tubs, steam rooms, or saunas. Do not douche. Do not use tampons or scented pads. Safety Talk to your provider before traveling far distances. Wear your seatbelt at all times when you're in a car. Talk to your provider if someone hits you, hurts you, or yells at you. Preparing for birth To prepare for your baby: Take childbirth  and breastfeeding classes. Visit the hospital and tour the maternity area. Buy a rear-facing car seat. Learn how to install it in your car. General instructions Avoid cat litter boxes and soil used by cats. These things carry germs that can cause harm to your pregnancy and your baby. Do not drink alcohol, smoke, vape, or use products with nicotine  or tobacco in them. If you need help quitting, talk with your provider. Keep all follow-up visits for your third trimester. Your provider will do more exams and tests during this trimester. Write down your questions. Take them to your prenatal visits. Your provider also will: Talk with you about your overall health. Give you advice or refer you to specialists who can help with different needs, including: Mental health and counseling. Foods and healthy eating. Ask for help if you need help with food. Where to find more information American Pregnancy Association: americanpregnancy.org [employer] of Obstetricians and Gynecologists: acog.org Office on [employer] Health: TravelLesson.ca Contact a health care provider if: You have a headache that does not go away when you take medicine. You have any of these problems: You can't eat or drink. You have nausea and vomiting. You have watery poop (diarrhea) for 2 days or more. You have pain when you pee, or your pee smells bad. You have been sick for 2 days or more and aren't getting better. Contact your provider right away if: You have any of these coming from your vagina: Abnormal discharge. Bad-smelling fluid. Bleeding. Your baby is moving less than usual. You have signs of labor: You have any contractions, belly cramping, or have pain in your pelvis or lower back before 37 weeks of pregnancy (preterm labor). You have regular contractions that are less than 5 minutes apart. Your water breaks. You have symptoms of high blood pressure or preeclampsia. These include: A severe, throbbing headache  that does not go away. Sudden or extreme swelling of your face, hands, legs, or feet. Vision problems: You see spots. You have blurry vision. Your eyes are sensitive to light. If you can't reach your provider, go to an urgent care or emergency room. Get help right away if: You faint, become confused, or can't think clearly. You have chest pain or trouble breathing. You have any kind of injury, such as from a fall or a car crash. These symptoms may be an emergency. Call 911 right away. Do not wait to see if the symptoms will go away. Do not drive yourself to the hospital. This information is not intended to replace advice given to you by your health care provider. Make sure you discuss any questions you have with your health care provider. Document Revised: 05/07/2023 Document Reviewed: 12/05/2022 Elsevier Patient Education  2024 [employer].

## 2024-06-10 ENCOUNTER — Ambulatory Visit: Admitting: Licensed Practical Nurse

## 2024-06-10 VITALS — BP 98/66 | HR 108 | Wt 165.2 lb

## 2024-06-10 DIAGNOSIS — Z3483 Encounter for supervision of other normal pregnancy, third trimester: Secondary | ICD-10-CM

## 2024-06-10 DIAGNOSIS — Z3480 Encounter for supervision of other normal pregnancy, unspecified trimester: Secondary | ICD-10-CM

## 2024-06-10 DIAGNOSIS — Z3A34 34 weeks gestation of pregnancy: Secondary | ICD-10-CM

## 2024-06-10 NOTE — Assessment & Plan Note (Signed)
-  TWG 44lbs, starting BMI 19. IS active and tends to eat a balanced diet.  -Discussed waterbirth, information placed in AVS -Encouraged to get support belt  -36wks labs next visit -Warning signs reviewed

## 2024-06-10 NOTE — Patient Instructions (Signed)
   Considering Waterbirth? Guide for patients at [facility]) Why consider waterbirth? Gentle birth for babies  Less pain medicine used in labor  May allow for passive descent/less pushing  May reduce perineal tears  More mobility and instinctive maternal position changes  Increased maternal relaxation   Is waterbirth safe? What are the risks of infection, drowning or other complications? Infection:  Very low risk (3.7 % for tub vs 4.8% for bed)  7 in 8000 waterbirths with documented infection  Poorly cleaned equipment most common cause  Slightly lower group B strep transmission rate  Drowning  Maternal:  Very low risk  Related to seizures or fainting  Newborn:  Very low risk. No evidence of increased risk of respiratory problems in multiple large studies  Physiological protection from breathing under water  Avoid underwater birth if there are any fetal complications  Once baby's head is out of the water, keep it out.  Birth complication  Some reports of cord trauma, but risk decreased by bringing baby to surface gradually  No evidence of increased risk of shoulder dystocia. Mothers can usually change positions faster in water than in a bed, possibly aiding the maneuvers to free the shoulder.   There are 2 things you MUST do to have a waterbirth with [facility]: Attend a waterbirth class at [employer] [facility]   3rd Wednesday of every month from 7-9 pm (virtual during COVID) [employer] at [email or web address] or [email or web address] or by calling [phone] Bring us  the certificate from the class to your prenatal appointment or send via MyChart Meet with a midwife at 36 weeks* to see if you can still plan a waterbirth and to sign the consent.   *We also recommend that you schedule as many of your prenatal visits with a midwife as possible.    Helpful information: You may want to bring a bathing suit top to the hospital  to wear during labor but this is optional.  All other supplies are provided by the hospital. Please arrive at the hospital with signs of active labor, and do not wait at home until late in labor. It takes 45 min- 1 hour for fetal monitoring, and check in to your room to take place, plus transport and filling of the waterbirth tub.    Things that would prevent you from having a waterbirth: Premature, <37wks  Previous cesarean birth  Presence of thick meconium-stained fluid  Multiple gestation (Twins, triplets, etc.)  Uncontrolled diabetes or gestational diabetes requiring medication  Hypertension diagnosed in pregnancy or preexisting hypertension (gestational hypertension, preeclampsia, or chronic hypertension) Fetal growth restriction (your baby measures less than 10th percentile on ultrasound) Heavy vaginal bleeding  Non-reassuring fetal heart rate  Active infection (MRSA, etc.). Group B Strep is NOT a contraindication for waterbirth.  If your labor has to be induced and induction method requires continuous monitoring of the baby's heart rate  Other risks/issues identified by your obstetrical provider   Please remember that birth is unpredictable. Under certain unforeseeable circumstances your provider may advise against giving birth in the tub. These decisions will be made on a case-by-case basis and with the safety of you and your baby as our highest priority.    Updated 11/20/21

## 2024-06-10 NOTE — Progress Notes (Signed)
    Return Prenatal Note   Subjective   23 y.o. G2P1001 at [redacted]w[redacted]d presents for this follow-up prenatal visit.  Patient  Patient reports: Doing ok, having some pelvic pressure/pain, especially after standing or sitting for too long. Occasional sciatic nerve discomfort, wonders if she has carpel tunnel as her hands are tender at night.  -Desires waterbirth, has a doula. Has already looked into renting a tub.   Movement: Present Contractions: Not present  Objective   Flow sheet Vitals: Pulse Rate: (!) 108 BP: 98/66 Fundal Height: 34 cm Fetal Heart Rate (bpm): 140 Total weight gain: 44 lb 3.2 oz (20 kg)  General Appearance  No acute distress, well appearing, and well nourished Pulmonary   Normal work of breathing Neurologic   Alert and oriented to person, place, and time Psychiatric   Mood and affect within normal limits   Assessment/Plan   Plan  23 y.o. G2P1001 at [redacted]w[redacted]d presents for follow-up OB visit. Reviewed prenatal record including previous visit note.  Supervision of other normal pregnancy, antepartum -TWG 44lbs, starting BMI 19. IS active and tends to eat a balanced diet.  -Discussed waterbirth, information placed in AVS -Encouraged to get support belt  -36wks labs next visit -Warning signs reviewed       No orders of the defined types were placed in this encounter.  No follow-ups on file.   Future Appointments  Date Time Provider Department Center  06/23/2024  3:35 PM Lynda Bradley, CNM [facility] None    For next visit:  ROB with GBS screening      JINNIE HERO [facility], CNM  06/11/2511:42 PM

## 2024-06-23 ENCOUNTER — Encounter: Payer: Self-pay | Admitting: Advanced Practice Midwife

## 2024-06-23 ENCOUNTER — Other Ambulatory Visit (HOSPITAL_COMMUNITY)
Admission: RE | Admit: 2024-06-23 | Discharge: 2024-06-23 | Disposition: A | Discharge status: Home / Self Care | Source: Ambulatory Visit | Attending: Advanced Practice Midwife | Admitting: Advanced Practice Midwife

## 2024-06-23 ENCOUNTER — Ambulatory Visit (INDEPENDENT_AMBULATORY_CARE_PROVIDER_SITE_OTHER): Admitting: Advanced Practice Midwife

## 2024-06-23 VITALS — BP 109/63 | HR 131 | Wt 171.3 lb

## 2024-06-23 DIAGNOSIS — Z3A36 36 weeks gestation of pregnancy: Secondary | ICD-10-CM

## 2024-06-23 DIAGNOSIS — Z113 Encounter for screening for infections with a predominantly sexual mode of transmission: Secondary | ICD-10-CM | POA: Insufficient documentation

## 2024-06-23 DIAGNOSIS — Z3483 Encounter for supervision of other normal pregnancy, third trimester: Secondary | ICD-10-CM | POA: Insufficient documentation

## 2024-06-23 DIAGNOSIS — Z3685 Encounter for antenatal screening for Streptococcus B: Secondary | ICD-10-CM

## 2024-06-23 NOTE — Progress Notes (Addendum)
 Routine Prenatal Care Visit  Subjective  Cynthia Schmitt is a 23 y.o. G2P1001 at [redacted]w[redacted]d being seen today for ongoing prenatal care.  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  ----------------------------------------------------------------------------------- Patient reports scratchy throat and sinus congestion since last night. Denies fever. (Tachycardic today). Reviewed safe medications. Having increased fullness/pressure feeling in pelvis.  Contractions: Not present. Vag. Bleeding: None.  Movement: Present. Leaking Fluid denies.  ----------------------------------------------------------------------------------- The following portions of the patient's history were reviewed and updated as appropriate: allergies, current medications, past family history, past medical history, past social history, past surgical history and problem list. Problem list updated.  Objective  BP 109/63   Pulse (!) 131   Wt 171 lb 4.8 oz (77.7 kg)   LMP 10/05/2023   BMI 27.65 kg/m  Pregravid weight 121 lb (54.9 kg) Total Weight Gain 50 lb 4.8 oz (22.8 kg) Urinalysis: Urine Protein    Urine Glucose    Fetal Status: Fetal Heart Rate (bpm): 145 Fundal Height: 36 cm Movement: Present      General:  Alert, oriented and cooperative. Patient is in no acute distress.  Skin: Skin is warm and dry. No rash noted.   Cardiovascular: Normal heart rate noted  Respiratory: Normal respiratory effort, no problems with respiration noted  Abdomen: Soft, gravid, appropriate for gestational age. Pain/Pressure: Present     Pelvic:  Cervical exam deferred      self swab aptima/GBS  Extremities: Normal range of motion.  Edema: None  Mental Status: Normal mood and affect. Normal behavior. Normal judgment  and thought content.   Assessment   23 y.o. G2P1001 at [redacted]w[redacted]d by  07/16/2024, by Ultrasound presenting for routine prenatal visit  Plan   G2 Problems (from 11/04/23 to present)     Problem Noted Diagnosed Resolved   Supervision of other normal pregnancy, antepartum 11/27/2023 by Tamea Annalee DEL, CMA  No   Overview Addendum 05/26/2024  8:28 AM by Taft Salines, LPN   Clinical Staff Provider  Office Location  [facility] Dating  By 8w u/s  Language  English Anatomy US   02/29/24 WNL  Flu Vaccine  04/25/24 Genetic Screen  NIPS: negative, female  RSV Vaccine  05/26/24    Covid Vaccine  Has not    TDaP vaccine  04/25/24 Hgb A1C or  GTT Early : not done Third trimester : 04/25/24 WNL  Covid Has not   LAB RESULTS   Rhogam  A/Negative/-- (05/19 1035) 04/25/24 Blood Type A/Negative/-- (05/19 1035)   RSV 05/26/24 Antibody Negative (09/08 0926)  Feeding Plan breast Rubella 1.47 (05/19 1035)  Contraception NONE RPR Non Reactive (09/08 0926)   Circumcision N/A HBsAg Negative (05/19 1035)   Pediatrician  [place] HIV Non Reactive (09/08 0926)  Support Person Husband and  Varicella Non Reactive (05/19 1035)  Prenatal Classes Yes GBS  (For PCN allergy, check sensitivities)     Hep C Non Reactive (05/19 1035)   BTL Consent N/A Pap Diagnosis  Date Value Ref Range Status  07/09/2021   Final   - Negative for intraepithelial lesion or malignancy (NILM)    VBAC Consent NA Hgb Electro      CF      SMA                    Preterm  labor symptoms and general obstetric precautions including but not limited to vaginal bleeding, contractions, leaking of fluid and fetal movement were reviewed in detail with the patient. Please refer to After Visit Summary for other counseling recommendations.   Return in about 1 week (around 06/30/2024) for rob.    Slater Rains, CNM [facility] 06/23/2024 3:57 PM

## 2024-06-25 LAB — STREP GP B NAA: Strep Gp B NAA: NEGATIVE

## 2024-06-27 ENCOUNTER — Encounter: Payer: Self-pay | Admitting: Obstetrics

## 2024-06-27 LAB — CERVICOVAGINAL ANCILLARY ONLY
Bacterial Vaginitis (gardnerella): NEGATIVE
Candida Glabrata: NEGATIVE
Candida Vaginitis: NEGATIVE
Chlamydia: NEGATIVE
Comment: NEGATIVE
Comment: NEGATIVE
Comment: NEGATIVE
Comment: NEGATIVE
Comment: NEGATIVE
Comment: NORMAL
Neisseria Gonorrhea: NEGATIVE
Trichomonas: NEGATIVE

## 2024-06-30 ENCOUNTER — Ambulatory Visit: Admitting: Obstetrics

## 2024-06-30 VITALS — BP 105/69 | HR 97 | Wt 170.2 lb

## 2024-06-30 DIAGNOSIS — Z3A37 37 weeks gestation of pregnancy: Secondary | ICD-10-CM | POA: Diagnosis not present

## 2024-06-30 DIAGNOSIS — Z3480 Encounter for supervision of other normal pregnancy, unspecified trimester: Secondary | ICD-10-CM

## 2024-06-30 DIAGNOSIS — Z3483 Encounter for supervision of other normal pregnancy, third trimester: Secondary | ICD-10-CM

## 2024-06-30 NOTE — Patient Instructions (Signed)
Natural Cervical Ripening Supplements  Red Raspberry Leaf Tea 2-4 cups daily.  Start at [redacted] weeks gestation and continue until labor.     Evening Primrose Oil (1000 mg) Take twice daily - by mouth in the morning and vaginally at bedtime. (If previous C-section, take both doses by mouth) Start at 38 weeks and continue until labor    Blue Cohosh 1 capsule daily starting at [redacted] weeks gestation 2 capsules daily starting at [redacted] weeks gestation 3 capsules daily starting at [redacted] weeks gestation  

## 2024-06-30 NOTE — Progress Notes (Addendum)
    Return Prenatal Note   Subjective  23 y.o. G2P1001 at [redacted]w[redacted]d presents for this follow-up prenatal visit.   Patient reports increased dizziness over the last week, but hasn't noticed any since Tuesday.  Also feels like her heart is beating fast, HR 100-120's on Apple Watch.  Normal today. Pt states was not checked for position at her 36wk visit.  Patient reports: Movement: Present Contractions: Not present Denies vaginal bleeding or leaking fluid. Objective  Flow sheet Vitals: Pulse Rate: 97 BP: 105/69 Fundal Height: 38 cm Fetal Heart Rate (bpm): 142 Presentation: Vertex (Leopold's) Total weight gain: 49 lb 3.2 oz (22.3 kg)  General Appearance  No acute distress, well appearing, and well nourished Pulmonary   Normal work of breathing Neurologic   Alert and oriented to person, place, and time Psychiatric   Mood and affect within normal limits   Assessment/Plan   Plan  23 y.o. G2P1001 at [redacted]w[redacted]d presents for follow-up OB visit. Reviewed prenatal record including previous visit note.  1. Supervision of other normal pregnancy, antepartum (Primary) -Labor precautions given -Natural cervical ripening agents in AVS -BSUS not working today, vertex by [employer], but slightly oblique with head in maternal right. US  at next visit for position.   Return in about 1 week (around 07/07/2024) for ROB.   Future Appointments  Date Time Provider Department Center  07/08/2024  3:35 PM Slaughterbeck, Damien, CNM [facility] None    For next visit:  continue with routine prenatal care    Estil Mangle, DO [facility]

## 2024-07-05 ENCOUNTER — Encounter: Payer: Self-pay | Admitting: Certified Nurse Midwife

## 2024-07-05 NOTE — Progress Notes (Signed)
 Name:  Cynthia Schmitt DOB: Oct 14, 2000 Date: 07/05/2024  ASSESSMENT/PLAN: Marrietta was seen today for uri.  Diagnoses and all orders for this visit:  Exudative tonsillitis -     POCT Rapid Strep A Screen - RN Obtain -     POCT SARS/FLU/RSV - RN OBTAIN -     amoxicillin  (AMOXIL ) 875 MG tablet; Take 1 tablet (875 mg total) by mouth every twelve (12) hours. -     Group A Strep Culture  Cough, unspecified type -     POCT SARS/FLU/RSV - RN OBTAIN  Left otitis media, unspecified otitis media type -     amoxicillin  (AMOXIL ) 875 MG tablet; Take 1 tablet (875 mg total) by mouth every twelve (12) hours.  Other orders -     POCT Rapid Strep A -     POCT SARS/Flu/RSV   Cynthia Schmitt is a 23 y.o. female  Assessment & Plan Acute pharyngitis (suspected streptococcal) and acute otitis media, left ear, in pregnancy   Suspected streptococcal pharyngitis with exudate and sore throat, with direct husband and son exposure.  Left ear otitis media also noted.  Treatment decision based on exposure, symptoms, and ear infection. Prescribe amoxicillin  for 10 days for suspected streptococcal pharyngitis and acute otitis media. Advise nasal saline irrigation and acetaminophen  for symptom relief. Recommend saltwater gargles for throat discomfort. Await results of throat swab and COVID, flu, RSV tests.  Strep PCR returned negative, will send for culture.  COVID-19, influenza RSV testing also negative.  Rest fluids and supportive care.  Prompt reevaluation for any persisting or worsening complaints. Discussed indication, risks and benefits of medications with patient.    Discussed follow up with Primary care physician this week as needed. Follow-up as Needed  Discussed follow up and return parameters including no resolution or any worsening concerns. Patient verbalized understanding and agreed to plan.   Pertinent labs & imaging results that were available during my care of the patient were reviewed by me and  considered in my medical decision making (see chart for details).   ------------------------------------------------------------------------------  Chief Complaint  Patient presents with  . URI    Started with cong 6 days ago. 3 days ago started with scratchy throat. Husband and son have strep.    HPI: Cynthia Schmitt is a 23 y.o. female is currently 81 weeks gravida 2 para 1 pregnant patient presenting for evaluation of nasal congestion, cough and sore throat.  History of Present Illness Cynthia Schmitt is a 23 year old female who presents with upper respiratory symptoms and sore throat.  She has experienced congestion for a week, with worsening symptoms over the weekend, including increased nasal congestion, rhinorrhea, and a cough on sunday.  Initially felt the nasal congestion was due to her pregnancy, she felt well otherwise.  She developed a scratchy sore throat over the weekend that has persisted.Cynthia Schmitt Her husband and son were diagnosed with strep throat last week, and she has been in close contact with them. She has a history of recurrent strep throat, with three episodes last year for similar presentation.  Husband and son also had cough congestion complaints.  She has not taken any medication for her current symptoms except for acetaminophen . She reports no fever, sinus pain, or ear pain. She felt fluid in one ear last night, but it is not problematic today. She is eating and drinking normally.  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 No bleeding, leaking, or spotting related to her pregnancy, and the baby is moving well.  Primary and OB/GYN at [facility]  ROS: Constitutional: No fever ENT: as above.  Cardiovascular: Denies chest pain. Respiratory: Denies shortness of breath. Gastrointestinal: No abdominal pain.  No nausea, no vomiting.  No diarrhea.   Skin: Negative for rash. Neurological: Negative for  focal weakness or numbness.  I have reviewed past medical, surgical, medications, allergies, social and family histories today and updated them in Epic where appropriate.  PMH: Past Medical History[1]  SURGICAL HX: Past Surgical History[2]  MEDS: Current Medications[3]  ALL: Allergies[4]  SH: Short Social History[5]  FH: Family History[6]  VITALS: Vitals:   07/05/24 1007  BP: 106/71  Pulse: 104  Resp: 15  Temp: 37.2 C (98.9 F)  SpO2: 97%   Body mass index is 27.79 kg/m.  Physical Exam Constitutional: Alert and oriented. Well appearing and in no acute distress. Eyes: Conjunctivae are normal.  ENT      Head: Normocephalic and atraumatic.      Ears: Right: Nontender, normal canal, no erythema, normal TM.  Left: Nontender, normal canal, erythematous and dull TM.      Nose: nasal congestion      Mouth/Throat: Mucous membranes are moist.  Pharyngeal erythema.  1+ bilateral tonsils with scant bilateral exudate present.  No uvular shift or deviation.  Clearing secretions well. Neck: No stridor. Supple without meningismus.  Hematological/Lymphatic/Immunilogical: Mild anterior bilateral cervical lymphadenopathy. Cardiovascular: Normal rate, regular rhythm. Grossly normal heart sounds.  Good peripheral circulation. Respiratory: Normal respiratory effort without tachypnea nor retractions. No wheezes, rales, rhonchi. Musculoskeletal:  Ambulatory with steady gait.  Neurologic:  Normal speech and language.  Skin:  Skin is warm, dry and intact. No rash noted. Psychiatric: Mood and affect are normal. Speech and behavior are normal. Patient exhibits appropriate insight and judgment  TEST  RESULTS:  Results for orders placed or performed in visit on 07/05/24  POCT Rapid Strep A  Result Value Ref Range   Rapid Strep A Screen Negative Negative  POCT SARS/Flu/RSV  Result Value Ref Range   POCT SARS-CoV-2 NAA Negative Negative   Influenza A Negative Negative   Influenza B  Negative Negative   POC Rapid RSV, NAA Negative Negative    SCREENINGS: SDOH:  Short Social History[7] nonsmoker No SDOH interventions needed at today's visit.     Morna Pinal, NP-C [facility] [place] ---------------------------------------------------------------- Note - This record has been created using [employer]. Chart creation errors have been sought, but may not always have been located. Such creation errors do not reflect on the standard of medical care.       [1] No past medical history on file. [2] No past surgical history on file. [3]  Current Outpatient Medications:  .  FLUoxetine  (PROZAC ) 10 MG capsule, Take 1 capsule (10 mg total) by mouth daily., Disp: , Rfl:  .  amoxicillin  (AMOXIL ) 875 MG tablet, Take 1 tablet (875 mg total) by mouth every twelve (12) hours., Disp: 20 tablet, Rfl: 0 .  levonorgestrel-ethinyl estradiol (SEASONALE) 0.15 mg-30 mcg (91) per tablet, Take 1 tablet by mouth daily. (Patient not taking: Reported on 07/05/2024), Disp: , Rfl:  .  lidocaine  4 % patch, Place 1 patch on the skin daily. (Patient not taking: Reported on 07/05/2024), Disp: 5 patch, Rfl: 0 [4] No Known Allergies [5] Social History Tobacco Use  . Smoking status: Never    Passive exposure: Never  . Smokeless tobacco: Never  Vaping Use  . Vaping status: Never Used  Substance Use Topics  . Alcohol use: Yes  . Drug use: Yes    Types: Marijuana  [6] History reviewed. No pertinent family history. [7] Social History Tobacco Use  . Smoking status: Never    Passive exposure: Never  . Smokeless tobacco: Never  Vaping Use  . Vaping status: Never Used  Substance Use Topics  . Alcohol use: Yes  . Drug use: Yes    Types: Marijuana

## 2024-07-08 ENCOUNTER — Encounter: Payer: Self-pay | Admitting: Certified Nurse Midwife

## 2024-07-08 ENCOUNTER — Ambulatory Visit: Admitting: Certified Nurse Midwife

## 2024-07-08 VITALS — BP 105/56 | HR 118 | Wt 170.8 lb

## 2024-07-08 DIAGNOSIS — Z3483 Encounter for supervision of other normal pregnancy, third trimester: Secondary | ICD-10-CM | POA: Diagnosis not present

## 2024-07-08 DIAGNOSIS — Z3A38 38 weeks gestation of pregnancy: Secondary | ICD-10-CM | POA: Diagnosis not present

## 2024-07-08 DIAGNOSIS — Z3480 Encounter for supervision of other normal pregnancy, unspecified trimester: Secondary | ICD-10-CM

## 2024-07-08 NOTE — Assessment & Plan Note (Signed)
 Ready for baby! Discussed scheduling 41 week induction at next visit if still pregnant. SVE - FT/40/-2, soft Using EPO and drinking red raspberry leaf tea. Feeling some BH contractions and cramping.  Reviewed labor warning signs and expectations for birth. Instructed to call office or come to hospital with persistent headache, vision changes, regular contractions, leaking of fluid, decreased fetal movement or vaginal bleeding.

## 2024-07-08 NOTE — Progress Notes (Signed)
    Return Prenatal Note   Subjective   23 y.o. G2P1001 at [redacted]w[redacted]d presents for this follow-up prenatal visit.  Patient has been having some B.H. contractions, she would like a cervial exam today. She reports good fetal movement. Patient reports: Movement: Present Contractions: Irritability  Objective   Flow sheet Vitals: Pulse Rate: (!) 118 BP: (!) 105/56 Fundal Height: 39 cm Fetal Heart Rate (bpm): 148 Presentation: Vertex (Leopolds) Dilation: Fingertip Effacement (%): 40 Station: -2 Total weight gain: 49 lb 12.8 oz (22.6 kg)  General Appearance  No acute distress, well appearing, and well nourished Pulmonary   Normal work of breathing Neurologic   Alert and oriented to person, place, and time Psychiatric   Mood and affect within normal limits   Assessment/Plan   Plan  23 y.o. G2P1001 at [redacted]w[redacted]d presents for follow-up OB visit. Reviewed prenatal record including previous visit note.  Supervision of other normal pregnancy, antepartum Ready for baby! Discussed scheduling 41 week induction at next visit if still pregnant. SVE - FT/40/-2, soft Using EPO and drinking red raspberry leaf tea. Feeling some BH contractions and cramping.  Reviewed labor warning signs and expectations for birth. Instructed to call office or come to hospital with persistent headache, vision changes, regular contractions, leaking of fluid, decreased fetal movement or vaginal bleeding.     Future Appointments  Date Time Provider Department Center  07/18/2024  1:35 PM Justino Eleanor HERO, CNM [facility] None     For next visit:  continue with routine prenatal care     Damien Parsley, CNM [facility]  11/21/253:57 PM

## 2024-07-10 ENCOUNTER — Inpatient Hospital Stay
Admission: EM | Admit: 2024-07-10 | Discharge: 2024-07-11 | DRG: 807 | Disposition: A | Discharge status: Home / Self Care

## 2024-07-10 ENCOUNTER — Other Ambulatory Visit: Payer: Self-pay

## 2024-07-10 DIAGNOSIS — O26893 Other specified pregnancy related conditions, third trimester: Secondary | ICD-10-CM | POA: Diagnosis present

## 2024-07-10 DIAGNOSIS — Z6791 Unspecified blood type, Rh negative: Secondary | ICD-10-CM

## 2024-07-10 DIAGNOSIS — D62 Acute posthemorrhagic anemia: Secondary | ICD-10-CM | POA: Diagnosis not present

## 2024-07-10 DIAGNOSIS — O9081 Anemia of the puerperium: Secondary | ICD-10-CM | POA: Diagnosis not present

## 2024-07-10 DIAGNOSIS — R198 Other specified symptoms and signs involving the digestive system and abdomen: Principal | ICD-10-CM | POA: Diagnosis present

## 2024-07-10 DIAGNOSIS — Z3A39 39 weeks gestation of pregnancy: Secondary | ICD-10-CM | POA: Diagnosis not present

## 2024-07-10 LAB — CBC
HCT: 35.6 % — ABNORMAL LOW (ref 36.0–46.0)
HCT: 28.7 % — ABNORMAL LOW (ref 36.0–46.0)
Hemoglobin: 11.6 g/dL — ABNORMAL LOW (ref 12.0–15.0)
Hemoglobin: 9 g/dL — ABNORMAL LOW (ref 12.0–15.0)
MCH: 26.2 pg (ref 26.0–34.0)
MCH: 25.9 pg — ABNORMAL LOW (ref 26.0–34.0)
MCHC: 32.6 g/dL (ref 30.0–36.0)
MCHC: 31.4 g/dL (ref 30.0–36.0)
MCV: 80.5 fL (ref 80.0–100.0)
MCV: 82.7 fL (ref 80.0–100.0)
Platelets: 151 K/uL (ref 150–400)
Platelets: 160 K/uL (ref 150–400)
RBC: 4.42 MIL/uL (ref 3.87–5.11)
RBC: 3.47 MIL/uL — ABNORMAL LOW (ref 3.87–5.11)
RDW: 14.2 % (ref 11.5–15.5)
RDW: 14.6 % (ref 11.5–15.5)
WBC: 10.5 K/uL (ref 4.0–10.5)
WBC: 10.1 K/uL (ref 4.0–10.5)
nRBC: 0 % (ref 0.0–0.2)
nRBC: 0 % (ref 0.0–0.2)

## 2024-07-10 LAB — FETAL SCREEN: Fetal Screen: NEGATIVE

## 2024-07-10 LAB — TYPE AND SCREEN
ABO/RH(D): A NEG
Antibody Screen: POSITIVE

## 2024-07-10 LAB — SYPHILIS: RPR W/REFLEX TO RPR TITER AND TREPONEMAL ANTIBODIES, TRADITIONAL SCREENING AND DIAGNOSIS ALGORITHM: RPR Ser Ql: NONREACTIVE

## 2024-07-10 MED ORDER — OXYTOCIN-SODIUM CHLORIDE 30-0.9 UT/500ML-% IV SOLN
INTRAVENOUS | Status: AC
  Administered 2024-07-10: 2.5 [IU]/h via INTRAVENOUS
  Filled 2024-07-10: qty 500

## 2024-07-10 MED ORDER — ONDANSETRON HCL 4 MG PO TABS: 4.0000 mg | ORAL_TABLET | ORAL | Status: DC | PRN

## 2024-07-10 MED ORDER — RHO D IMMUNE GLOBULIN 1500 UNIT/2ML IJ SOSY
300.0000 ug | PREFILLED_SYRINGE | Freq: Once | INTRAMUSCULAR | Status: AC
  Administered 2024-07-10: 300 ug via INTRAVENOUS
  Filled 2024-07-10: qty 2

## 2024-07-10 MED ORDER — OXYTOCIN BOLUS FROM INFUSION: 333.0000 mL | Freq: Once | INTRAVENOUS | Status: AC

## 2024-07-10 MED ORDER — ONDANSETRON HCL 4 MG/2ML IJ SOLN: 4.0000 mg | INTRAMUSCULAR | Status: DC | PRN

## 2024-07-10 MED ORDER — FLUOXETINE HCL 10 MG PO CAPS
10.0000 mg | ORAL_CAPSULE | Freq: Every day | ORAL | Status: DC
  Administered 2024-07-10 – 2024-07-11 (×2): 10 mg via ORAL
  Filled 2024-07-10 (×2): qty 1

## 2024-07-10 MED ORDER — ONDANSETRON HCL 4 MG/2ML IJ SOLN: 4.0000 mg | Freq: Four times a day (QID) | INTRAMUSCULAR | Status: DC | PRN

## 2024-07-10 MED ORDER — IBUPROFEN 600 MG PO TABS
600.0000 mg | ORAL_TABLET | Freq: Four times a day (QID) | ORAL | Status: DC
  Administered 2024-07-10 – 2024-07-11 (×6): 600 mg via ORAL
  Filled 2024-07-10 (×6): qty 1

## 2024-07-10 MED ORDER — ACETAMINOPHEN 325 MG PO TABS
650.0000 mg | ORAL_TABLET | ORAL | Status: DC | PRN
  Administered 2024-07-10: 650 mg via ORAL
  Filled 2024-07-10: qty 2

## 2024-07-10 MED ORDER — MISOPROSTOL 200 MCG PO TABS
ORAL_TABLET | ORAL | Status: AC
  Filled 2024-07-10: qty 4

## 2024-07-10 MED ORDER — VARICELLA VIRUS VACCINE LIVE 1350 PFU/0.5ML IJ SUSR
0.5000 mL | INTRAMUSCULAR | Status: AC | PRN
  Administered 2024-07-11: 0.5 mL via SUBCUTANEOUS
  Filled 2024-07-10: qty 0.5

## 2024-07-10 MED ORDER — AMMONIA AROMATIC IN INHA
RESPIRATORY_TRACT | Status: AC
  Filled 2024-07-10: qty 10

## 2024-07-10 MED ORDER — SENNOSIDES-DOCUSATE SODIUM 8.6-50 MG PO TABS
2.0000 | ORAL_TABLET | ORAL | Status: DC
  Administered 2024-07-10 – 2024-07-11 (×2): 2 via ORAL
  Filled 2024-07-10 (×2): qty 2

## 2024-07-10 MED ORDER — LIDOCAINE HCL (PF) 1 % IJ SOLN: 30.0000 mL | INTRAMUSCULAR | Status: AC | PRN

## 2024-07-10 MED ORDER — LIDOCAINE HCL (PF) 1 % IJ SOLN
INTRAMUSCULAR | Status: AC
  Administered 2024-07-10: 10 mL via SUBCUTANEOUS
  Filled 2024-07-10: qty 30

## 2024-07-10 MED ORDER — OXYCODONE HCL 5 MG PO TABS: 10.0000 mg | ORAL_TABLET | ORAL | Status: DC | PRN

## 2024-07-10 MED ORDER — OXYTOCIN 10 UNIT/ML IJ SOLN
INTRAMUSCULAR | Status: AC
  Filled 2024-07-10: qty 2

## 2024-07-10 MED ORDER — LACTATED RINGERS IV SOLN: 500.0000 mL | INTRAVENOUS | Status: AC | PRN

## 2024-07-10 MED ORDER — FENTANYL CITRATE (PF) 100 MCG/2ML IJ SOLN: 50.0000 ug | INTRAMUSCULAR | Status: DC | PRN

## 2024-07-10 MED ORDER — LACTATED RINGERS IV SOLN: INTRAVENOUS | Status: AC

## 2024-07-10 MED ORDER — IBUPROFEN 600 MG PO TABS: 600.0000 mg | ORAL_TABLET | Freq: Four times a day (QID) | ORAL | Status: DC

## 2024-07-10 MED ORDER — OXYTOCIN-SODIUM CHLORIDE 30-0.9 UT/500ML-% IV SOLN: 2.5000 [IU]/h | INTRAVENOUS | Status: DC

## 2024-07-10 MED ORDER — BENZOCAINE-MENTHOL 20-0.5 % EX AERO
1.0000 | INHALATION_SPRAY | CUTANEOUS | Status: DC | PRN
  Filled 2024-07-10: qty 56

## 2024-07-10 MED ORDER — FERROUS SULFATE 325 (65 FE) MG PO TABS
325.0000 mg | ORAL_TABLET | Freq: Every day | ORAL | Status: DC
  Administered 2024-07-10 – 2024-07-11 (×2): 325 mg via ORAL
  Filled 2024-07-10 (×2): qty 1

## 2024-07-10 MED ORDER — DIBUCAINE (PERIANAL) 1 % EX OINT: 1.0000 | TOPICAL_OINTMENT | CUTANEOUS | Status: DC | PRN

## 2024-07-10 MED ORDER — WITCH HAZEL-GLYCERIN EX PADS: 1.0000 | MEDICATED_PAD | CUTANEOUS | Status: DC | PRN

## 2024-07-10 MED ORDER — METHYLERGONOVINE MALEATE 0.2 MG/ML IJ SOLN: 0.2000 mg | INTRAMUSCULAR | Status: DC | PRN

## 2024-07-10 MED ORDER — OXYTOCIN-SODIUM CHLORIDE 30-0.9 UT/500ML-% IV SOLN
INTRAVENOUS | Status: AC
  Administered 2024-07-10: 333 mL via INTRAVENOUS
  Filled 2024-07-10: qty 500

## 2024-07-10 MED ORDER — SIMETHICONE 80 MG PO CHEW: 80.0000 mg | CHEWABLE_TABLET | ORAL | Status: DC | PRN

## 2024-07-10 MED ORDER — METHYLERGONOVINE MALEATE 0.2 MG PO TABS: 0.2000 mg | ORAL_TABLET | ORAL | Status: DC | PRN

## 2024-07-10 MED ORDER — DOCUSATE SODIUM 100 MG PO CAPS: 100.0000 mg | ORAL_CAPSULE | Freq: Two times a day (BID) | ORAL | Status: DC

## 2024-07-10 MED ORDER — COCONUT OIL OIL: 1.0000 | TOPICAL_OIL | Status: DC | PRN

## 2024-07-10 MED ORDER — OXYCODONE HCL 5 MG PO TABS: 5.0000 mg | ORAL_TABLET | ORAL | Status: DC | PRN

## 2024-07-10 MED ORDER — PRENATAL MULTIVITAMIN CH
1.0000 | ORAL_TABLET | Freq: Every day | ORAL | Status: DC
  Administered 2024-07-10 – 2024-07-11 (×2): 1 via ORAL
  Filled 2024-07-10 (×3): qty 1

## 2024-07-10 NOTE — H&P (Signed)
 History and Physical   HPI  Cynthia Schmitt is a 23 y.o. G2P1001 at [redacted]w[redacted]d Estimated Date of Delivery: 07/16/24 who is being admitted for labor management   OB History  OB History  Gravida Para Term Preterm AB Living  2 1 1  0 0 1  SAB IAB Ectopic Multiple Live Births  0 0 0 0 1    # Outcome Date GA Lbr Len/2nd Weight Sex Type Anes PTL Lv  2 Current           1 Term 05/28/21 [redacted]w[redacted]d / 01:26 3800 g M Vag-Spont EPI  LIV     Name: Cynthia Schmitt     Apgar1: 6  Apgar5: 6    PROBLEM LIST  Pregnancy complications or risks: Patient Active Problem List   Diagnosis Date Noted   Abdominal complaints 07/10/2024   Labial varicosities 05/09/2024   ADHD (attention deficit hyperactivity disorder) 04/25/2024   Supervision of other normal pregnancy, antepartum 11/27/2023   Rh negative state in antepartum period 05/28/2021    Prenatal labs and studies: ABO, Rh: A/Negative/-- (05/19 1035) Antibody: Negative (09/08 0926) Rubella: 1.47 (05/19 1035) RPR: Non Reactive (09/08 0926)  HBsAg: Negative (05/19 1035)  HIV: Non Reactive (09/08 0926)  HAD:Wzhjupcz/-- (11/06 1601)   Past Medical History:  Diagnosis Date   ADHD      Past Surgical History:  Procedure Laterality Date   DENTAL SURGERY       Medications    Current Discharge Medication List     CONTINUE these medications which have NOT CHANGED   Details  amoxicillin  (AMOXIL ) 875 MG tablet Take 875 mg by mouth 2 (two) times daily.    FLUoxetine  (PROZAC ) 10 MG capsule Take 1 capsule (10 mg total) by mouth daily. Qty: 30 capsule, Refills: 3    ondansetron  (ZOFRAN -ODT) 4 MG disintegrating tablet DISSOLVE 1 TABLET IN MOUTH EVERY 8 HOURS AS NEEDED FOR NAUSEA OR VOMITING Qty: 20 tablet, Refills: 0   Associated Diagnoses: Nausea and vomiting during pregnancy    Prenatal Vit-Fe Fumarate-FA (PRENATAL PO) Take by mouth.         Allergies  Patient has no known allergies.  Review of Systems  Constitutional:  negative Eyes: negative Ears, nose, mouth, throat, and face: negative Respiratory: negative Cardiovascular: negative Gastrointestinal: negative Genitourinary:negative Integument/breast: negative Hematologic/lymphatic: negative Musculoskeletal:negative Neurological: negative Behavioral/Psych: negative Endocrine: negative Allergic/Immunologic: negative  Physical Exam  LMP 10/05/2023   Lungs:  CTA B Cardio: RRR without M/R/G Abd: Soft, gravid, NT Presentation: cephalic EXT: No C/C/ 1+ Edema DTRs: 2+ B CERVIX: 9 cm per RN exam    See Prenatal records for more detailed PE.     FHR:  Baseline: 120 bpm, Variability: Good {> 6 bpm), Accelerations: Reactive, and Decelerations: Absent  Toco: Uterine Contractions: Frequency: Every 2-3 minutes and Intensity: strong  Test Results  No results found for this or any previous visit (from the past 24 hours). Group B Strep negative  Assessment   G2P1001 at [redacted]w[redacted]d Estimated Date of Delivery: 07/16/24  The fetus is reassuring.   Patient Active Problem List   Diagnosis Date Noted   Abdominal complaints 07/10/2024   Labial varicosities 05/09/2024   ADHD (attention deficit hyperactivity disorder) 04/25/2024   Supervision of other normal pregnancy, antepartum 11/27/2023   Rh negative state in antepartum period 05/28/2021    Plan  1. Admit to L&D :   2. EFM:-- Category 1 3. IV pain meds, nitrous,  or Epidural if desired.  4. Admission labs  5. Anticipate NSVD  Zelda Hummer, CNM  07/10/2024 2:45 AM

## 2024-07-10 NOTE — Discharge Summary (Signed)
 Postpartum Discharge Summary  Date of Service updated***     Patient Name: Cynthia Schmitt DOB: May 22, 2001 MRN: [id number]  Date of admission: 07/10/2024 Delivery date:07/10/2024 Delivering provider: Sebastian Sham CNM   Date of discharge: 07/10/2024  Admitting diagnosis: Abdominal complaints [R19.8] Intrauterine pregnancy: [redacted]w[redacted]d     Secondary diagnosis:  Principal Problem:   Abdominal complaints  Additional problems: none    Discharge diagnosis: Term Pregnancy Delivered                                              Post partum procedures:{Postpartum procedures:23558} Augmentation: N/A Complications: None  Hospital course: Onset of Labor With Vaginal Delivery      23 y.o. yo G2P1001 at [redacted]w[redacted]d was admitted in Active Labor on 07/10/2024. Labor course was uncomplicated. Membrane Rupture Time/Date: 12:54 AM,07/10/2024  Delivery Method:Vaginal, Spontaneous Operative Delivery:{Operative Delivery:30121} Episiotomy:  none Lacerations:   second degree vaginal/perineal  Patient had a postpartum course complicated by ***.  She is ambulating, tolerating a regular diet, passing flatus, and urinating well. Patient is discharged home in stable condition on 07/10/24.  Newborn Data: Birth date:07/10/2024 Birth time:2:00 AM Gender:Female Living status:Living Apgars:8 ,9  Weight:   Magnesium Sulfate received: No BMZ received: No Rhophylac :{Rhophylac  received:30440032} MMR:N/A T-DaP:Given prenatally Flu: Yes RSV Vaccine received: Yes Transfusion:No Immunizations administered: Immunization History  Administered Date(s) Administered    sv, Bivalent, Protein Subunit Rsvpref,pf [place]) 05/26/2024   Influenza, Seasonal, Injecte, Preservative Fre 04/25/2024   Influenza,inj,Quad PF,6+ Mos 05/07/2021   Tdap 03/12/2021, 04/25/2024    Physical exam  There were no vitals filed for this visit. General: {Exam; general:21111117} Lochia: {Desc;  appropriate/inappropriate:30686::appropriate} Uterine Fundus: {Desc; firm/soft:30687} Incision: {Exam; incision:21111123} DVT Evaluation: {Exam; icu:7888877} Labs: Lab Results  Component Value Date   WBC 8.9 04/25/2024   HGB 10.5 (L) 04/25/2024   HCT 32.4 (L) 04/25/2024   MCV 91 04/25/2024   PLT 204 04/25/2024      Latest Ref Rng & Units 04/08/2023    8:44 AM  CMP  Glucose 70 - 99 mg/dL 75   BUN 6 - 20 mg/dL 8   Creatinine 9.42 - 8.99 mg/dL 9.20   Sodium 865 - 855 mmol/L 142   Potassium 3.5 - 5.2 mmol/L 4.4   Chloride 96 - 106 mmol/L 106   CO2 20 - 29 mmol/L 21   Calcium 8.7 - 10.2 mg/dL 9.1   Total Protein 6.0 - 8.5 g/dL 6.8   Total Bilirubin 0.0 - 1.2 mg/dL 0.4   Alkaline Phos 44 - 121 IU/L 95   AST 0 - 40 IU/L 20   ALT 0 - 32 IU/L 35    Edinburgh Score:    01/04/2024    9:53 AM  Edinburgh Postnatal Depression Scale Screening Tool  I have been able to laugh and see the funny side of things. 0   I have looked forward with enjoyment to things. 0   I have blamed myself unnecessarily when things went wrong. 0   I have been anxious or worried for no good reason. 1   I have felt scared or panicky for no good reason. 1   Things have been getting on top of me. 1   I have been so unhappy that I have had difficulty sleeping. 0   I have felt sad or miserable. 0   I have been so unhappy  that I have been crying. 0   The thought of harming myself has occurred to me. 0   Edinburgh Postnatal Depression Scale Total 3      Data saved with a previous flowsheet row definition      After visit meds:  Allergies as of 07/10/2024   No Known Allergies   Med Rec must be completed prior to using this [facility]***        Discharge home in stable condition Infant Feeding: {Baby feeding:23562} Infant Disposition:{CHL IP OB HOME WITH FNUYZM:76418} Discharge instruction: per After Visit Summary and Postpartum booklet. Activity: Advance as tolerated. Pelvic rest for 6 weeks.   Diet: {OB diet:21111121} Anticipated Birth Control: {Birth Control:23956} Postpartum Appointment:{Outpatient follow up:23559} Additional Postpartum F/U: {PP Procedure:23957} Future Appointments: Future Appointments  Date Time Provider Department Center  07/18/2024  1:35 PM Justino Eleanor HERO, CNM [facility] None   Follow up Visit:      07/10/2024 Zelda Hummer, CNM

## 2024-07-10 NOTE — Lactation Note (Signed)
 This note was copied from a baby's chart. Lactation Consultation Note  Patient Name: Cynthia Schmitt Today's Date: 07/10/2024 Age:24 hours Reason for consult: Initial assessment   Maternal Data  Initial assessment w/a an experienced breastfeeding patient. Infant 65hrs old and this is patient's second baby. This was an SVD. Patient w/a history of ADHD but no other pregnancy complications affecting lactation. Mom stated that she has several pumps at home; Spectra , Eufy, Mom Cozy, Medela.  Mom stated that feedings are going well. She requested that [facility] visit before discharge to size her flanges for at home pump use.    Feeding Mother's Current Feeding Choice: Breast Milk  LATCH Score Latch: Too sleepy or reluctant, no latch achieved, no sucking elicited.  Audible Swallowing: None  Type of Nipple: Everted at rest and after stimulation  Comfort (Breast/Nipple): Soft / non-tender  Hold (Positioning): No assistance needed to correctly position infant at breast.  LATCH Score: 6   Interventions Interventions: Breast feeding basics reviewed  LC discussed breastfeeding basics to include tummy size, feedings at least 8-12x in a 24hr period, arousing infant, deep latch, pumping frequency, hunger cues.  Discharge Pump: Personal;Hands Free;Manual;DEBP (Spectra , Eufy, Mom Cozy, Medela) WIC Program: Yes  Consult Status Consult Status: Follow-up Follow-up type: In-patient    Jon Georgi 07/10/2024, 1:53 PM

## 2024-07-11 LAB — RHOGAM INJECTION: Unit division: 0

## 2024-07-11 MED ORDER — FERROUS SULFATE 325 (65 FE) MG PO TABS: 325.0000 mg | ORAL_TABLET | Freq: Every day | ORAL | 3 refills | Status: AC

## 2024-07-11 NOTE — Progress Notes (Signed)
 Pt discharged with infant.  Discharge instructions, prescriptions and follow up appointment given to and reviewed with pt. Pt verbalized understanding. Escorted out by auxillary.

## 2024-07-11 NOTE — Final Progress Note (Signed)
 Post Partum Day   Subjective: Cynthia Schmitt is feeling well overall. She is ambulating, voiding, and tolerating POs without difficulty. Her pain is well-controlled and her bleeding is WNL. Her mood is stable. Breastfeeding is going well.   Objective: Blood pressure 111/63, pulse 76, temperature 98.3 F (36.8 C), temperature source Oral, resp. rate 18, last menstrual period 10/05/2023, SpO2 100%, unknown if currently breastfeeding.  Physical Exam:  General: alert, cooperative, and no distress Lochia: appropriate Uterine Fundus: firm Perineum: Healing well with no significant drainage DVT Evaluation: No evidence of DVT seen on physical exam.  Recent Labs    07/10/24 0035 07/10/24 1426  HGB 11.6* 9.0*  HCT 35.6* 28.7*    Assessment/Plan: Discharge home PO iron Discharge instructions reviewed Video visit in 2 weeks Office visit in 6 weeks  Undecided about contraception - abstinence for now   LOS: 1 day   Eleanor CHRISTELLA Canny, CNM 07/11/2024, 11:48 AM

## 2024-07-11 NOTE — Lactation Note (Addendum)
 This note was copied from a baby's chart. Lactation Consultation Note  Patient Name: Cynthia Schmitt Date: 07/11/2024 Age:24 hours Reason for consult: Follow-up assessment (Discharge education)   Maternal Data   Follow-up assessment w/ MOB. MOB shared concerns about not getting a deep enough latch. Expressed pain upon initial latch and pain subsides eventually during feed. MOB and FOB  confirmed baby feeds mostly on left breast and MOB expressed she feels like the left breast produces more milk. MOB also shared that she will be returning to work in ~6 weeks.   Feeding Mother's Current Feeding Choice: Breast Milk  10:56 Baby was ready to feed and MOB called for LC support. LC Student assessed and observed shallow latch. [facility] Student assisted with positioning and assisted MOB with getting deeper latch. MOB expressed latch felt better than previous attempts.    Lactation Tools Discussed/Used Breast Shells     Interventions Interventions: CDC milk storage guidelines;Education; Breast shells  LC Student assessed MOB's breast. Left nipple more prominent with some irritation. [facility] Student reinforced benefits of good positioning and gaping mouth for deep latch. LC Student measured nipples for flange sizing and provided milk storage guidelines. LC Student also provided discharge education on feeding on demand, milk transitions, engorgement and hand expression. LC offered to assess latch when baby was ready to feed again.   To aid with nipple repair, LC provided breast shells and education on use.   Discharge Discharge Education: Engorgement and breast care;Warning signs for feeding baby;Outpatient recommendation  Consult Status Consult Status: Complete Follow-up type: Call as needed    Lavern Louder, Lactation Student 07/11/2024, 10:54 AM

## 2024-07-18 ENCOUNTER — Encounter: Admitting: Obstetrics

## 2024-07-26 NOTE — Progress Notes (Unsigned)
 Virtual Visit via Video Note  I connected with NAME@ on 07/26/24 at   9:55 AM EST by a video enabled telemedicine application and verified that I am speaking with the correct person using two identifiers.  Location: Patient: *** Provider: OFFICE   I discussed the limitations of evaluation and management by telemedicine and the availability of in person appointments. The patient expressed understanding and agreed to proceed.    History of Present Illness:   Cynthia Schmitt is a 23 y.o. G18P2002 female who presents for a 2 week televisit for mood check. She is 2 weeks postpartum following a normal spontaneous vaginal delivery.  The delivery was at 39.1 gestational weeks.  Postpartum course has been well so far. Baby is feeding by ***. Bleeding: ***. Postpartum depression screening: {Desc; negative/positive:13464}.  EDPS score is ***.      The following portions of the patient's history were reviewed and updated as appropriate: allergies, current medications, past family history, past medical history, past social history, past surgical history, and problem list.   Observations/Objective:   unknown if currently breastfeeding. Gen App: NAD Psych: normal speech, affect. Good mood.        07/10/2024    5:49 PM 07/10/2024    5:35 AM 01/04/2024    9:53 AM 07/09/2021    9:35 AM 06/11/2021    1:33 PM  Edinburgh Postnatal Depression Scale Screening Tool  I have been able to laugh and see the funny side of things. 0 -- 0  0  0   I have looked forward with enjoyment to things. 0  0  0  0   I have blamed myself unnecessarily when things went wrong. 0  0  0  0   I have been anxious or worried for no good reason. 2  1  0  0   I have felt scared or panicky for no good reason. 2  1  0  0   Things have been getting on top of me. 1  1  0  0   I have been so unhappy that I have had difficulty sleeping. 0  0  0  0   I have felt sad or miserable. 0  0  0  0   I have been so unhappy that I have been  crying. 0  0  0  0   The thought of harming myself has occurred to me. 0  0  0  0   Edinburgh Postnatal Depression Scale Total 5  3  0  0      Data saved with a previous flowsheet row definition         Assessment and Plan:   1. Encounter for screening for maternal depression - Screening {FINDINGS; POSITIVE NEGATIVE:[phone]} today. Will rescreen at 6 week postpartum visit. Overall doing well.    2. Postpartum state - Overall doing well. Continue routine postpartum home care.    3. Lactating mother -    Follow Up Instructions:     I discussed the assessment and treatment plan with the patient. The patient was provided an opportunity to ask questions and all were answered. The patient agreed with the plan and demonstrated an understanding of the instructions.   The patient was advised to call back or seek an in-person evaluation if the symptoms worsen or if the condition fails to improve as anticipated.   I provided *** minutes of non-face-to-face time during this encounter.     Damien Javia Dillow,  CNM [facility]

## 2024-07-27 ENCOUNTER — Telehealth: Admitting: Certified Nurse Midwife

## 2024-07-27 NOTE — Patient Instructions (Signed)

## 2024-08-01 ENCOUNTER — Other Ambulatory Visit: Payer: Self-pay | Admitting: Certified Nurse Midwife

## 2024-08-14 ENCOUNTER — Encounter: Payer: Self-pay | Admitting: Certified Nurse Midwife

## 2024-08-15 NOTE — Telephone Encounter (Signed)
 The patient has been contacted and scheduled for 1/8 with Zelda T for postpartum follow up.

## 2024-08-20 NOTE — ED Provider Notes (Signed)
 [facility] Emergency Department Provider Note    ED Clinical Impression    Final diagnoses:  Acute pulmonary embolism, unspecified pulmonary embolism type, unspecified whether acute cor pulmonale present    (CMS-HCC) (Primary)  Breast feeding status of mother (HHS-HCC)      Impression, Medical Decision Making, Progress Notes and Critical Care    Impression, Differential Diagnosis and Plan of Care  24 year old female with no significant past medical history who is 6 weeks postpartum and presents to the emergency department for evaluation of sharp right lateral/posterior chest wall pain.  It is worsened with movement and with coughing.  She denies any urinary symptoms and has no significant CVA tenderness to suggest renal stone, however given location of her discomfort, this could explain her symptoms and plan is for urinalysis.  She has no right upper quadrant tenderness or Murphy sign to suggest a biliary etiology of her symptoms.  She has no worsening with food and has no nausea to suggest this either.  Labs showed no evidence of obstructive hepatobiliary process.  Chest x-ray shows no evidence of pneumonia or pneumothorax.  Given the pleuritic nature of her pain, D-dimer was obtained that is significantly elevated at 5000.  Given this, CTA of the chest has been ordered and is pending at 7:00 signout.  She has been treated symptomatically and plan is for reevaluation after CT imaging.  Independent Interpretation of Studies  I have independently interpreted the following studies: EKG: Normal sinus rhythm, rate 77, normal axis, normal intervals, no ST elevation, ST depression, or T wave version.  Impression: Sinus rhythm. X-ray(s): No pneumonia, no pneumothorax.  Portions of this record have been created using Scientist, clinical (histocompatibility and immunogenetics). Dictation errors have been sought, but may not have been identified and corrected.  See chart and nursing documentation for additional  details.  ____________________________________________      History    Reason for Visit Rib Pain  History of Present Illness Cynthia Schmitt is a 24 year old female who presents with severe right side and back pain.  She has been experiencing severe pain in right lateral and posterior chest wall since yesterday, which has progressively worsened. The pain is exacerbated by movement, deep breathing, and coughing. It radiates from the side to the back and sometimes extends into the shoulder.  She has attempted to manage the pain with Tylenol  and ibuprofen , but these have not provided relief. She last took ibuprofen  around 7 PM last night.  No associated symptoms such as nausea, vomiting, fever, chills, or urinary symptoms. Eating and drinking do not seem to affect the pain.  She has not experienced any recent trauma or falls that could explain the pain. She also denies any recent swelling in her legs or history of blood clots.  Her past medical history includes anxiety, for which she takes fluoxetine  (Prozac ) regularly.   Past Medical History[1]  There is no problem list on file for this patient.[2]  Past Surgical History[3]  No current facility-administered medications for this encounter.  Current Outpatient Medications:    amoxicillin  (AMOXIL ) 875 MG tablet, Take 1 tablet (875 mg total) by mouth every twelve (12) hours., Disp: 20 tablet, Rfl: 0   apixaban (ELIQUIS) 5 mg Tab, Take 2 tablets (10 mg total) by mouth two (2) times a day for 7 days, THEN 1 tablet (5 mg total) two (2) times a day for 14 days., Disp: 56 tablet, Rfl: 0   FLUoxetine  (PROZAC ) 10 MG capsule, Take 1 capsule (  10 mg total) by mouth daily., Disp: , Rfl:    levonorgestrel-ethinyl estradiol (SEASONALE) 0.15 mg-30 mcg (91) per tablet, Take 1 tablet by mouth daily. (Patient not taking: Reported on 07/05/2024), Disp: , Rfl:    lidocaine  4 % patch, Place 1 patch on the skin daily. (Patient not taking: Reported on  07/05/2024), Disp: 5 patch, Rfl: 0  Allergies Patient has no known allergies.  Family History[4]  Social History Short Social History[5]    Physical Exam   ED Triage Vitals [08/20/24 0048]  Enc Vitals Group     BP 123/75     Pulse 83     SpO2 Pulse      Resp 16     Temp 36.4 C (97.5 F)     Temp Source Temporal     SpO2 95 %     Weight 75.8 kg (167 lb)   Constitutional: Alert and oriented. Well appearing and in no distress. Eyes: Conjunctivae are normal. ENT      Head: Normocephalic and atraumatic.      Nose: No congestion.      Mouth/Throat: Mucous membranes are moist.      Neck: No stridor. Cardiovascular: Normal rate, regular rhythm. Normal and symmetric distal pulses are present in all extremities. Respiratory: Normal respiratory effort. Breath sounds are normal.  Mild tenderness in the right lower chest wall. Gastrointestinal: Soft and nontender. There is no CVA tenderness.  Mild discomfort on palpation in the right upper quadrant but no rebound or guarding. Musculoskeletal: Normal range of motion in all extremities.      Right lower leg: No tenderness or edema.      Left lower leg: No tenderness or edema. Neurologic: Normal speech and language. No gross focal neurologic deficits are appreciated. Skin: Skin is warm, dry and intact. No rash noted. Psychiatric: Mood and affect are normal. Speech and behavior are normal.   Radiology   CTA Chest W Contrast  Final Result    Acute pulmonary emboli is evident within the segmental and subsegmental distribution of the lower lobe of the right lung. Associated, there is a small focus of emboli related infarction/reperfusion/hemorrhage in the posterior basilar segment and a trace ipsilateral pleural effusion.      XR Chest 2 views  Final Result    No acute findings.                      [1] No past medical history on file. [2] There is no problem list on file for this patient.  [3] No past surgical  history on file. [4] No family history on file. [5] Social History Tobacco Use   Smoking status: Never    Passive exposure: Never   Smokeless tobacco: Never  Vaping Use   Vaping status: Never Used  Substance Use Topics   Alcohol use: Yes   Drug use: Yes    Types: Marijuana   Claudene Morene Dawn, MD 08/22/24 1354

## 2024-08-22 ENCOUNTER — Encounter: Payer: Self-pay | Admitting: Certified Nurse Midwife

## 2024-08-24 NOTE — Patient Instructions (Signed)
 Postpartum Care After Vaginal Delivery The following information offers guidance about how to care for yourself from the time you deliver your baby to 6-12 weeks after delivery (postpartum period). If you have problems or questions, contact your health care provider for more specific instructions. Follow these instructions at home: Vaginal bleeding It is normal to have vaginal bleeding (lochia) after delivery. Wear a sanitary pad for bleeding and discharge. During the first week after delivery, the amount and appearance of lochia is often similar to a menstrual period. Over the next few weeks, it will gradually decrease to a dry, yellow-brown discharge. For most women, lochia stops completely by 4-6 weeks after delivery, but it can vary. Change your sanitary pads frequently. Watch for any changes in your flow, such as: An increase in bleeding. A change in color. Large blood clots. If you pass a blood clot the size of an egg or larger, contact your healthcare provider.. Do not use tampons or douches until your health care provider approves. If you are not breastfeeding, your period should return 6-8 weeks after delivery. If you are feeding your baby breast milk only, your period may not return until you stop breastfeeding. Perineal care  Keep the area between the vagina and the anus (perineum) clean and dry. Use medicated pads and pain-relieving sprays or creams as told. If you had a tear or a surgical cut in the perineum (episiotomy), check the area for signs of infection until you are healed. Check for: More redness, swelling, or pain. Pus or a bad smell. You may be given a squirt bottle to use instead of wiping to clean the perineum area after you use the bathroom. Pat the area gently to dry it. To relieve pain caused by an episiotomy, a tear, or swollen veins in the anus (hemorrhoids), take a warm sitz bath 2-4 times a day, or as many as told by your health care provider. You can use a  bathtub or a basin you put over the toilet as a sitz bath. Breast care In the first few days after delivery, your breasts may feel heavy, full, and uncomfortable (breast engorgement). Milk may also leak from your breasts. Ask your health care provider about ways to help relieve the discomfort. If you breastfeed: Wear a bra that supports your breasts and fits well. Use breast pads to absorb milk that leaks. Keep your nipples clean and dry. Apply creams and ointments as told. You may have uterine contractions every time you breastfeed for up to several weeks after delivery. This helps your uterus return to its normal size. If you have any problems with breastfeeding, tell your health care provider or lactation specialist. If you do not breastfeed: Avoid touching your breasts. Do not squeeze out (express) milk. Doing this can make your breasts produce more milk. Wear a bra that supports your breasts and fits well. Use cold packs to help with swelling. Talk to your health care provider about what over-the-counter medicines can help with pain. Intimacy and sexuality Ask your health care provider when you can engage in sexual activity. This may depend upon: Your risk of infection. How fast you are healing. Your comfort and desire to engage in sexual activity. You are able to get pregnant after delivery, even if you have not had your period. Talk with your health care provider about birth control (contraception) or family planning. Medicines Take over-the-counter and prescription medicines only as told by your health care provider. Take an over-the-counter stool softener to help  ease bowel movements as told by your health care provider. If you were prescribed antibiotics, take them as told by your health care provider. Do not stop using the antibiotic even if you start to feel better. Review all previous and current prescriptions to check for the possible transfer into your breast milk. Ask your  health care provider or lactation specialist for help if needed. Activity Gradually return to your normal activities as told by your health care provider. Rest as much as possible. Try to nap while your baby is sleeping. Eating and drinking  Drink enough fluid to keep your urine pale yellow. To help prevent or relieve constipation, eat high-fiber foods every day. Choose healthy eating to support breastfeeding and healing. Take your prenatal vitamins until your health care provider tells you to stop. General recommendations Do not use any products that contain nicotine or tobacco. These products include cigarettes, chewing tobacco, and vaping devices, such as e-cigarettes. These can delay healing. If you need help quitting, ask your health care provider. Secondhand smoke exposure is dangerous for your baby. It can increase the risk of illness and sudden infant death syndrome (SIDS). Nicotine and other chemicals pass through breast milk to the baby. Alcohol use can be dangerous during the postpartum period. Drinking alcohol may impair your judgment and ability to safely care for your baby. Not drinking alcohol is the safest option for breastfeeding mothers. Alcohol passes through breast milk to the baby. Alcohol can be damaging to your baby's development, growth, and sleep patterns. If you breastfeed and drink alcohol, wait at least 2 hours after a single drink before feeding your baby. Do not take medicines or drugs that are not prescribed to you, especially if you breastfeed. Visit your health care provider for a postpartum checkup within the first 3-6 weeks after delivery. Complete a comprehensive postpartum visit no later than 12 weeks after delivery. Keep all follow-up visits. Your health care provider will check your healing after delivery and also check your blood pressure. Where to find more information U.S. Department of Health and Cyt[employer] of Women's Health:  TravelLesson.ca [employer]necologists: acog.org Contact a health care provider if: You feel unusually sad or worried. Your breasts become red, painful, or hard. You have nausea and vomiting and are unable to eat or drink anything for 24 hours. You have a fever or other signs of infection. You have bleeding that soaks through one pad an hour or you have blood clots the size of an egg or larger. You have a severe headache that does not go away or you have a headache with vision changes. Get help right away if: You have chest pain or difficulty breathing. You have sudden, severe leg pain. You faint or have a seizure. You have thoughts about hurting yourself or your baby. You have any of the following symptoms and you were unable to reach your health care provider: A fever or other signs of infection. Bleeding that is soaking through one pad an hour or you have blood clots the size of an egg or larger. A severe headache that does not go away or you have a headache with vision changes. These symptoms may be an emergency. Get help right away. Call 911. Do not wait to see if the symptoms will go away. Do not drive yourself to the hospital. Get help if you ever feel like you may hurt yourself or others, or have thoughts about taking your own life. Go  to your nearest emergency room or: Call 911. Call the National Suicide Prevention Lifeline at 315-702-5467 or 988. This is open 24 hours a day. Text the Crisis Text Line at 507-826-2619. This information is not intended to replace advice given to you by your health care provider. Make sure you discuss any questions you have with your health care provider. Document Revised: 05/15/2022 Document Reviewed: 11/05/2021 Elsevier Patient Education  2024 [employer].

## 2024-08-24 NOTE — Progress Notes (Addendum)
" ° °  OBSTETRICS POSTPARTUM CLINIC PROGRESS NOTE  Subjective:     Cynthia Schmitt is a 24 y.o. G77P2002 female who presents for a postpartum visit. She is 6 week postpartum following a spontaneous vaginal delivery. I have fully reviewed the prenatal and intrapartum course. The delivery was at 39 gestational weeks.  Anesthesia: none. Postpartum course has been well. Baby's course has been well. Baby is feeding by breast. Bleeding: patient has not not resumed menses, with No LMP recorded.. Bowel function is normal. Bladder function is normal. Patient is not sexually active. Contraception method desired is natural family planning. Postpartum depression screening: negative.  EDPS score is 5.    The following portions of the patient's history were reviewed and updated as appropriate: allergies, current medications, past family history, past medical history, past social history, past surgical history, and problem list.  Review of Systems Pertinent items are noted in HPI.   Objective:    BP 108/86   Pulse 79   Wt 146 lb (66.2 kg)   Breastfeeding Yes   BMI 23.57 kg/m   General:  alert and no distress   Breasts:  inspection negative, no nipple discharge or bleeding, no masses or nodularity palpable  Lungs: clear to auscultation bilaterally  Heart:  regular rate and rhythm, S1, S2 normal, no murmur, click, rub or gallop  Abdomen: soft, non-tender; bowel sounds normal; no masses,  no organomegaly.    Vulva:  normal  Vagina: normal vagina, no discharge, exudate, lesion, or erythema  Cervix:  no cervical motion tenderness and no lesions  Adnexa:  normal adnexa and no mass, fullness, tenderness  Rectal Exam: Not performed.         Labs:  Lab Results  Component Value Date   HGB 9.0 (L) 07/10/2024     Edinburgh Postnatal Depression Scale - 08/25/24 1104       Edinburgh Postnatal Depression Scale:  In the Past 7 Days   I have been able to laugh and see the funny side of things. 0    I have looked  forward with enjoyment to things. 0    I have blamed myself unnecessarily when things went wrong. 0    I have been anxious or worried for no good reason. 2    I have felt scared or panicky for no good reason. 2    Things have been getting on top of me. 1    I have been so unhappy that I have had difficulty sleeping. 0    I have felt sad or miserable. 0    I have been so unhappy that I have been crying. 0    The thought of harming myself has occurred to me. 0    Edinburgh Postnatal Depression Scale Total 5          Assessment:   1. Postpartum care following vaginal delivery      Plan:    1. Contraception: natural family planning 2.  Breast feeding  3. Follow up in: 9 months or as needed.    Sebastian Sham, CNM [facility] "

## 2024-08-25 ENCOUNTER — Ambulatory Visit: Admitting: Certified Nurse Midwife

## 2024-08-25 ENCOUNTER — Encounter: Payer: Self-pay | Admitting: Certified Nurse Midwife

## 2024-09-01 ENCOUNTER — Other Ambulatory Visit: Payer: Self-pay | Admitting: Certified Nurse Midwife

## 2024-09-07 ENCOUNTER — Other Ambulatory Visit: Payer: Self-pay | Admitting: Licensed Practical Nurse

## 2024-09-07 ENCOUNTER — Encounter: Payer: Self-pay | Admitting: Certified Nurse Midwife

## 2024-09-07 DIAGNOSIS — O219 Vomiting of pregnancy, unspecified: Secondary | ICD-10-CM

## 2024-09-07 MED ORDER — ONDANSETRON 4 MG PO TBDP: 4.0000 mg | ORAL_TABLET | Freq: Three times a day (TID) | ORAL | 0 refills | Status: AC | PRN

## 2024-09-14 ENCOUNTER — Ambulatory Visit (INDEPENDENT_AMBULATORY_CARE_PROVIDER_SITE_OTHER)

## 2024-09-14 ENCOUNTER — Other Ambulatory Visit (HOSPITAL_COMMUNITY)
Admission: RE | Admit: 2024-09-14 | Discharge: 2024-09-14 | Disposition: A | Discharge status: Home / Self Care | Source: Ambulatory Visit | Attending: Licensed Practical Nurse | Admitting: Licensed Practical Nurse

## 2024-09-14 VITALS — BP 96/72 | Resp 16 | Ht 66.0 in | Wt 144.7 lb

## 2024-09-14 DIAGNOSIS — N898 Other specified noninflammatory disorders of vagina: Secondary | ICD-10-CM | POA: Diagnosis present

## 2024-09-14 NOTE — Patient Instructions (Signed)
 Vaginal Infection (Bacterial Vaginosis): What to Know  Bacterial vaginosis is an infection of the vagina. It happens when the balance of normal germs (bacteria) in the vagina changes. If you don't get treated, it can make it easier for you to get other infections from sex. These are called sexually transmitted infections (STIs). If you're pregnant, you need to get treated right away. This infection can cause a baby to be born early or at a low birth weight. What are the causes? This infection happens when too many harmful germs grow in the vagina. You can't get this infection from toilet seats, bedsheets, swimming pools, or things that touch your vagina. What increases the risk? Having sex with a new person or more than one person. Having sex without protection. Douching. Having an intrauterine device (IUD). Smoking. Using drugs or drinking alcohol. These can lead you to do risky things. Taking certain antibiotics. Being pregnant. What are the signs or symptoms? Some females have no symptoms. Symptoms may include: A gray or white discharge from your vagina. It can be watery or foamy. A fishy smell. This can happen after sex or during your menstrual period. Itching in and around your vagina. Burning or pain when you pee. How is this treated? This infection is treated with antibiotics. These may be given to you as: A pill. A cream for your vagina. A medicine that you put into your vagina (suppository). If the infection comes back, you may need more antibiotics. Follow these instructions at home: Medicines Take your medicines as told. Take or use your antibiotics as told. Do not stop using them even if you start to feel better. General instructions If the person you have sex with is a female, tell her that you have this infection. She will need to follow up with her doctor. Female partners don't need to be treated. Do not have sex until you finish treatment. Drink more fluids as  told. Keep your vagina and butt clean. Wash these areas with warm water each day. Wipe from front to back after you poop. If you're breastfeeding a baby, talk to your doctor if you should keep doing so during treatment. How is this prevented? Self-care Do not douche. Do not use deodorant sprays on your vagina. Wear cotton underwear. Do not wear tight pants and pantyhose, especially in the summer. Safe sex Use condoms the correct way and every time you have sex. Use dental dams to protect yourself during oral sex. Limit how many people you have sex with. Get tested for STIs. The person you have sex with should also get tested. Drugs and alcohol Do not smoke, vape, or use nicotine or tobacco. Do not use drugs. Limit the amount of alcohol you drink because it can lead you to do risky things. Where to find more information To learn more: Go to [employer]erProm[employer].no. Click Health Topics A-Z. Type bacterial vaginosis in the search bar. American Sexual Health Association (ASHA): ashasexualhealth.org U.S. Department of Health and [employer], Office on Women's Health: TravelLesson.ca Contact a doctor if: Your symptoms don't get better, even after treatment. You have more discharge or pain when you pee. You have a fever or chills. You have pain in your belly or in the area between your hips. You have pain during sex. You bleed from your vagina between menstrual periods. This information is not intended to replace advice given to you by your health care provider. Make sure you discuss any questions you have with your health care provider. Document  Revised: 01/21/2023 Document Reviewed: 01/21/2023 Elsevier Patient Education  2024 [employer].

## 2024-09-14 NOTE — Progress Notes (Signed)
" ° ° °  NURSE VISIT NOTE  Subjective:    Patient ID: Cynthia Schmitt, female    DOB: [birth year], 24 y.o.   MRN: [id number]  HPI  Patient is a 24 y.o. G18P2002 female who presents for green vaginal discharge with foul odor for 2 week(s). Denies abnormal vaginal bleeding or significant pelvic pain or fever. She denies dysuria, hematuria, urinary frequency, urinary urgency, flank pain, and pelvic pain. Patient denies a and does not have a history of known exposure to STD.   Objective:    BP 96/72   Resp 16   Ht 5' 6 (1.676 m)   Wt 144 lb 11.2 oz (65.6 kg)   BMI 23.36 kg/m    No results found for any visits on 09/14/24.  Assessment:   1. Vaginal discharge   2. Vaginal odor     bacterial vaginosis, trichomonas, rule out GC or chlamydia, and nonspecific vaginitis  Plan:   GC and chlamydia DNA  probe sent to lab. Treatment: pHd samples given to patient. Check mychart for results and treatment. ROV prn if symptoms persist or worsen.   Camelia Fetters, CMA [facility]

## 2024-09-15 ENCOUNTER — Ambulatory Visit: Payer: Self-pay

## 2024-09-15 LAB — CERVICOVAGINAL ANCILLARY ONLY
Bacterial Vaginitis (gardnerella): NEGATIVE
Candida Glabrata: NEGATIVE
Candida Vaginitis: NEGATIVE
Chlamydia: NEGATIVE
Comment: NEGATIVE
Comment: NEGATIVE
Comment: NEGATIVE
Comment: NEGATIVE
Comment: NEGATIVE
Comment: NORMAL
Neisseria Gonorrhea: NEGATIVE
Trichomonas: NEGATIVE
